# Patient Record
Sex: Female | Born: 1935 | Race: White | Hispanic: No | Smoking: Former smoker
Health system: Southern US, Community
[De-identification: ages and names within clinical notes are randomized; demographics above are authoritative.]

## PROBLEM LIST (undated history)

## (undated) DIAGNOSIS — I10 Essential (primary) hypertension: Secondary | ICD-10-CM

## (undated) DIAGNOSIS — M1990 Unspecified osteoarthritis, unspecified site: Secondary | ICD-10-CM

## (undated) DIAGNOSIS — J302 Other seasonal allergic rhinitis: Secondary | ICD-10-CM

## (undated) DIAGNOSIS — R002 Palpitations: Secondary | ICD-10-CM

## (undated) DIAGNOSIS — T7840XA Allergy, unspecified, initial encounter: Secondary | ICD-10-CM

## (undated) DIAGNOSIS — E785 Hyperlipidemia, unspecified: Secondary | ICD-10-CM

## (undated) DIAGNOSIS — H269 Unspecified cataract: Secondary | ICD-10-CM

## (undated) DIAGNOSIS — I471 Supraventricular tachycardia: Secondary | ICD-10-CM

## (undated) DIAGNOSIS — K219 Gastro-esophageal reflux disease without esophagitis: Secondary | ICD-10-CM

## (undated) DIAGNOSIS — I4719 Other supraventricular tachycardia: Secondary | ICD-10-CM

## (undated) HISTORY — DX: Gastro-esophageal reflux disease without esophagitis: K21.9

## (undated) HISTORY — PX: TONSILLECTOMY: SUR1361

## (undated) HISTORY — DX: Unspecified cataract: H26.9

## (undated) HISTORY — PX: ABDOMINAL HYSTERECTOMY: SHX81

## (undated) HISTORY — DX: Unspecified osteoarthritis, unspecified site: M19.90

## (undated) HISTORY — PX: ADENOIDECTOMY: SUR15

## (undated) HISTORY — DX: Other supraventricular tachycardia: I47.19

## (undated) HISTORY — PX: FOOT SURGERY: SHX648

## (undated) HISTORY — PX: WISDOM TOOTH EXTRACTION: SHX21

## (undated) HISTORY — PX: BREAST MASS EXCISION: SHX1267

## (undated) HISTORY — DX: Palpitations: R00.2

## (undated) HISTORY — DX: Allergy, unspecified, initial encounter: T78.40XA

## (undated) HISTORY — DX: Supraventricular tachycardia: I47.1

## (undated) HISTORY — PX: COLONOSCOPY: SHX174

## (undated) HISTORY — PX: DILATION AND CURETTAGE OF UTERUS: SHX78

## (undated) HISTORY — PX: CATARACT EXTRACTION: SUR2

---

## 1998-01-08 ENCOUNTER — Other Ambulatory Visit: Admission: RE | Admit: 1998-01-08 | Discharge: 1998-01-08 | Payer: Self-pay | Admitting: Obstetrics and Gynecology

## 1999-03-05 ENCOUNTER — Other Ambulatory Visit: Admission: RE | Admit: 1999-03-05 | Discharge: 1999-03-05 | Payer: Self-pay | Admitting: Obstetrics and Gynecology

## 1999-08-19 ENCOUNTER — Other Ambulatory Visit: Admission: RE | Admit: 1999-08-19 | Discharge: 1999-08-19 | Payer: Self-pay | Admitting: Obstetrics and Gynecology

## 1999-11-26 ENCOUNTER — Other Ambulatory Visit: Admission: RE | Admit: 1999-11-26 | Discharge: 1999-11-26 | Payer: Self-pay | Admitting: Obstetrics and Gynecology

## 2001-05-26 ENCOUNTER — Other Ambulatory Visit: Admission: RE | Admit: 2001-05-26 | Discharge: 2001-05-26 | Payer: Self-pay | Admitting: Obstetrics and Gynecology

## 2002-06-07 ENCOUNTER — Other Ambulatory Visit: Admission: RE | Admit: 2002-06-07 | Discharge: 2002-06-07 | Payer: Self-pay | Admitting: Obstetrics and Gynecology

## 2005-04-01 ENCOUNTER — Ambulatory Visit: Payer: Self-pay | Admitting: Gastroenterology

## 2005-04-15 ENCOUNTER — Ambulatory Visit: Payer: Self-pay | Admitting: Gastroenterology

## 2008-04-10 ENCOUNTER — Ambulatory Visit: Payer: Self-pay | Admitting: Vascular Surgery

## 2010-07-14 NOTE — Consult Note (Signed)
NEW PATIENT Dawn Jimenez, [name]  DOB:  1936-01-11                                       04/10/2008  VOZDG#:[id number]   The patient presented today for evaluation of venous pathology.  She is  a very pleasant 75 year old white female who had a stress fracture in  her left heel.  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  She wished to have evaluation of this to  rule out the more significant venous pathology.  She does not have any  history of deep venous thrombosis or superficial thrombophlebitis.  She  does not have any history of varicose veins.   PAST HISTORY:  Significant for hypertension and elevated cholesterol,  both well controlled with medications.   FAMILY HISTORY:  Negative for premature atherosclerotic disease.   SOCIAL HISTORY:  She is married with 2 children.  She is a retired  Fish farm manager.  She does not smoke, having quit 47 years ago.  She  does have a glass of wine nightly with dinner.   REVIEW OF SYSTEMS:  Weight is reportedly 126 pounds, she is 5 feet 5  inches tall.  She is negative for cardiac, pulmonary, GI, GU or neuro  symptoms.  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   MEDICATION ALLERGIES:  Suprax.   CURRENT MEDICATIONS:  Hydrochlorothiazide, lisinopril, aspirin,  Caltrate, glucosamine, multivitamins and Vitamin D.   PHYSICAL EXAMINATION:  Vital Signs:  Her blood pressure is 151/86, pulse  66, respirations 18.  Her radial pulses and dorsalis pulses are 2+  bilaterally.  She has scattered telangiectasia over her thighs and legs  bilaterally, these are somewhat more pronounced on the medial aspect  over her left medial distal calf above her malleolus.  She has some  swelling associated with an old ankle fracture but no significant  swelling and she does wear moderate grade support hose.   She underwent  screening venous duplex by me and this reveals normal  great and small saphenous vein with no evidence of reflux.  I discussed  this at length with the patient.  I explained that this does not  preclude her to any more serious complications associated with venous  pathology.  She is not concerned regarding the appearance of her  telangiectasia.  I did discuss that the treatment for this would be  sclerotherapy if she wished treatment.  She will continue with her  support stockings since they do give her some comfort and relief.  She  was reassured with this discussion and will see [place] again on an as-needed  basis.   Larina Earthly, M.D.  Electronically Signed   TFE/MEDQ  D:  04/10/2008  T:  04/11/2008  Job:  [id number]   cc:   Lunette Stands, M.D.  Asencion Gowda, M.D.

## 2011-05-20 DIAGNOSIS — Z90710 Acquired absence of both cervix and uterus: Secondary | ICD-10-CM | POA: Insufficient documentation

## 2011-05-20 DIAGNOSIS — J309 Allergic rhinitis, unspecified: Secondary | ICD-10-CM | POA: Insufficient documentation

## 2011-06-07 DIAGNOSIS — I2510 Atherosclerotic heart disease of native coronary artery without angina pectoris: Secondary | ICD-10-CM | POA: Insufficient documentation

## 2011-06-22 ENCOUNTER — Emergency Department (INDEPENDENT_AMBULATORY_CARE_PROVIDER_SITE_OTHER): Payer: Medicare Other

## 2011-06-22 ENCOUNTER — Encounter (HOSPITAL_BASED_OUTPATIENT_CLINIC_OR_DEPARTMENT_OTHER): Payer: Self-pay | Admitting: Emergency Medicine

## 2011-06-22 ENCOUNTER — Emergency Department (HOSPITAL_BASED_OUTPATIENT_CLINIC_OR_DEPARTMENT_OTHER)
Admission: EM | Admit: 2011-06-22 | Discharge: 2011-06-22 | Disposition: A | Discharge status: Home / Self Care | Payer: Medicare Other | Attending: Emergency Medicine | Admitting: Emergency Medicine

## 2011-06-22 DIAGNOSIS — E785 Hyperlipidemia, unspecified: Secondary | ICD-10-CM | POA: Insufficient documentation

## 2011-06-22 DIAGNOSIS — S93409A Sprain of unspecified ligament of unspecified ankle, initial encounter: Secondary | ICD-10-CM | POA: Insufficient documentation

## 2011-06-22 DIAGNOSIS — X58XXXA Exposure to other specified factors, initial encounter: Secondary | ICD-10-CM | POA: Insufficient documentation

## 2011-06-22 DIAGNOSIS — M25579 Pain in unspecified ankle and joints of unspecified foot: Secondary | ICD-10-CM

## 2011-06-22 DIAGNOSIS — I10 Essential (primary) hypertension: Secondary | ICD-10-CM | POA: Insufficient documentation

## 2011-06-22 DIAGNOSIS — S93402A Sprain of unspecified ligament of left ankle, initial encounter: Secondary | ICD-10-CM

## 2011-06-22 HISTORY — DX: Essential (primary) hypertension: I10

## 2011-06-22 HISTORY — DX: Hyperlipidemia, unspecified: E78.5

## 2011-06-22 HISTORY — DX: Other seasonal allergic rhinitis: J30.2

## 2011-06-22 NOTE — Discharge Instructions (Signed)
Ankle Sprain An ankle sprain is an injury to the strong, fibrous tissues (ligaments) that hold the bones of your ankle joint together.  CAUSES Ankle sprain usually is caused by a fall or by twisting your ankle. People who participate in sports are more prone to these types of injuries.  SYMPTOMS  Symptoms of ankle sprain include:  Pain in your ankle. The pain may be present at rest or only when you are trying to stand or walk.   Swelling.   Bruising. Bruising may develop immediately or within 1 to 2 days after your injury.   Difficulty standing or walking.  DIAGNOSIS  Your caregiver will ask you details about your injury and perform a physical exam of your ankle to determine if you have an ankle sprain. During the physical exam, your caregiver will press and squeeze specific areas of your foot and ankle. Your caregiver will try to move your ankle in certain ways. An X-ray exam may be done to be sure a bone was not broken or a ligament did not separate from one of the bones in your ankle (avulsion).  TREATMENT  Certain types of braces can help stabilize your ankle. Your caregiver can make a recommendation for this. Your caregiver may recommend the use of medication for pain. If your sprain is severe, your caregiver may refer you to a surgeon who helps to restore function to parts of your skeletal system (orthopedist) or a physical therapist. HOME CARE INSTRUCTIONS  Apply ice to your injury for 1 to 2 days or as directed by your caregiver. Applying ice helps to reduce inflammation and pain.  Put ice in a plastic bag.   Place a towel between your skin and the bag.   Leave the ice on for 15 to 20 minutes at a time, every 2 hours while you are awake.   Take over-the-counter or prescription medicines for pain, discomfort, or fever only as directed by your caregiver.   Keep your injured leg elevated, when possible, to lessen swelling.   If your caregiver recommends crutches, use them as  instructed. Gradually, put weight on the affected ankle. Continue to use crutches or a cane until you can walk without feeling pain in your ankle.   If you have a plaster splint, wear the splint as directed by your caregiver. Do not rest it on anything harder than a pillow the first 24 hours. Do not put weight on it. Do not get it wet. You may take it off to take a shower or bath.   You may have been given an elastic bandage to wear around your ankle to provide support. If the elastic bandage is too tight (you have numbness or tingling in your foot or your foot becomes cold and blue), adjust the bandage to make it comfortable.   If you have an air splint, you may blow more air into it or let air out to make it more comfortable. You may take your splint off at night and before taking a shower or bath.   Wiggle your toes in the splint several times per day if you are able.  SEEK MEDICAL CARE IF:   You have an increase in bruising, swelling, or pain.   Your toes feel cold.   Pain relief is not achieved with medication.  SEEK IMMEDIATE MEDICAL CARE IF: Your toes are numb or blue or you have severe pain. MAKE SURE YOU:   Understand these instructions.   Will watch your condition.     Will get help right away if you are not doing well or get worse.  Document Released: 02/15/2005 Document Revised: 02/04/2011 Document Reviewed: 09/20/2007 [employer] Patient Information 2012 [employer]. 

## 2011-06-22 NOTE — ED Notes (Signed)
Pt c/o sudden onset of left ankle pain after standing up from table. Pt denies any known injury.

## 2011-06-23 NOTE — ED Provider Notes (Signed)
History     CSN: [id number]  Arrival date & time 06/22/11  7829   First MD Initiated Contact with Patient 06/22/11 1943      Chief Complaint  Patient presents with  . Ankle Pain    (Consider location/radiation/quality/duration/timing/severity/associated sxs/prior treatment) HPI Patient is a 76 yo F who felt sudden pain in her left ankle upon standing from a seated position today.  There is no history of other trauma and patient has history of osteopenia.  She reports pain is now 2/10 and without radiation.  She has been ambulating on the leg since the pain began.  She just wanted to get checked out as she is going on a trip next week.  The patient reports that she has no history of cancer, heavy steroid use, or osteoporosis that would put her at risk for fracture. There are no other associated or modifying factors.  Past Medical History  Diagnosis Date  . Hypertension   . Hyperlipemia   . Seasonal allergies     Past Surgical History  Procedure Date  . Abdominal hysterectomy     History reviewed. No pertinent family history.  History  Substance Use Topics  . Smoking status: Never Smoker   . Smokeless tobacco: Not on file  . Alcohol Use: Yes    OB History    Grav Para Term Preterm Abortions TAB SAB Ect Mult Living                  Review of Systems  Constitutional: Negative.   HENT: Negative.   Eyes: Negative.   Respiratory: Negative.   Cardiovascular: Negative.   Gastrointestinal: Negative.   Genitourinary: Negative.   Musculoskeletal:       See HPI  Skin: Negative.   Neurological: Negative.   Hematological: Negative.   Psychiatric/Behavioral: Negative.   All other systems reviewed and are negative.    Allergies  Suprax  Home Medications   Current Outpatient Rx  Name Route Sig Dispense Refill  . ASPIRIN 81 MG PO TABS Oral Take 81 mg by mouth daily.    . ATORVASTATIN CALCIUM 20 MG PO TABS Oral Take 20 mg by mouth daily.    Marland Kitchen VITAMIN D 1000 UNITS  PO TABS Oral Take 1,000 Units by mouth daily.    Marland Kitchen FLUTICASONE PROPIONATE 50 MCG/ACT NA SUSP Nasal Place 2 sprays into the nose daily.    Marland Kitchen FOLIC ACID PO Oral Take 1 tablet by mouth daily.    Marland Kitchen HYDROCHLOROTHIAZIDE 25 MG PO TABS Oral Take 25 mg by mouth daily.    Marland Kitchen LISINOPRIL 10 MG PO TABS Oral Take 10 mg by mouth daily.    . ADULT MULTIVITAMIN W/MINERALS CH Oral Take 1 tablet by mouth daily.    Marland Kitchen MEDERMA EX GEL Topical Apply 1 application topically daily.    Marland Kitchen VITAMIN B-12 PO Oral Take 1 tablet by mouth daily.      BP 124/71  Pulse 86  Temp(Src) 97.9 F (36.6 C) (Oral)  Resp 18  Ht 5\' 4"  (1.626 m)  Wt 130 lb (58.968 kg)  BMI 22.31 kg/m2  SpO2 97%  Physical Exam  Nursing note and vitals reviewed. GEN: Well-developed, well-nourished female in no distress HEENT: Atraumatic, normocephalic.  EYES: PERRLA BL, no scleral icterus. NECK: Trachea midline, no meningismus CV: regular rate and rhythm.  PULM: No respiratory distress.   Neuro: cranial nerves 2-12 intact, no abnormalities of strength or sensation, A and O x 3 MSK: TTP over the left  lateral malleolus with slight ecchymosis and swelling noted. No joint instability or other deformity noted. DP and PT pulses are 2 + and sensation is intact.Skin: No rashes petechiae, purpura, or jaundice Psych: no abnormality of mood   ED Course  Procedures (including critical care time)  Labs Reviewed - No data to display Dg Ankle Complete Left  06/22/2011  *RADIOLOGY REPORT*  Clinical Data: Lateral ankle pain.  LEFT ANKLE COMPLETE - 3+ VIEW  Comparison: None.  Findings: No evidence of acute fracture or dislocation.  No evidence of ankle joint effusion.  Incidental note is made of a small plantar calcaneal spur.  IMPRESSION: No acute findings.  Original Report Authenticated By: Danae Orleans, M.D.     1. Left ankle sprain       MDM  Patient was examined and plain film was negative.  Ace wrap and ice pack were administered.  Patient was  able to ambulate without difficulty.  She was given referral to Dr. Pearletha Forge to follow-up as needed if symptoms do not improve.  She was discharged in good condition with instructions to use RICE therapy.        Cyndra Numbers, MD 06/23/11 [id number]

## 2011-08-30 DIAGNOSIS — R918 Other nonspecific abnormal finding of lung field: Secondary | ICD-10-CM | POA: Insufficient documentation

## 2012-08-31 IMAGING — CR DG ANKLE COMPLETE 3+V*L*
3 series · 3 of 3 positions shown · non-contrast
Comparison: None.

CLINICAL DATA: Lateral ankle pain.

LEFT ANKLE COMPLETE - 3+ VIEW

[t ankle joint ap left]
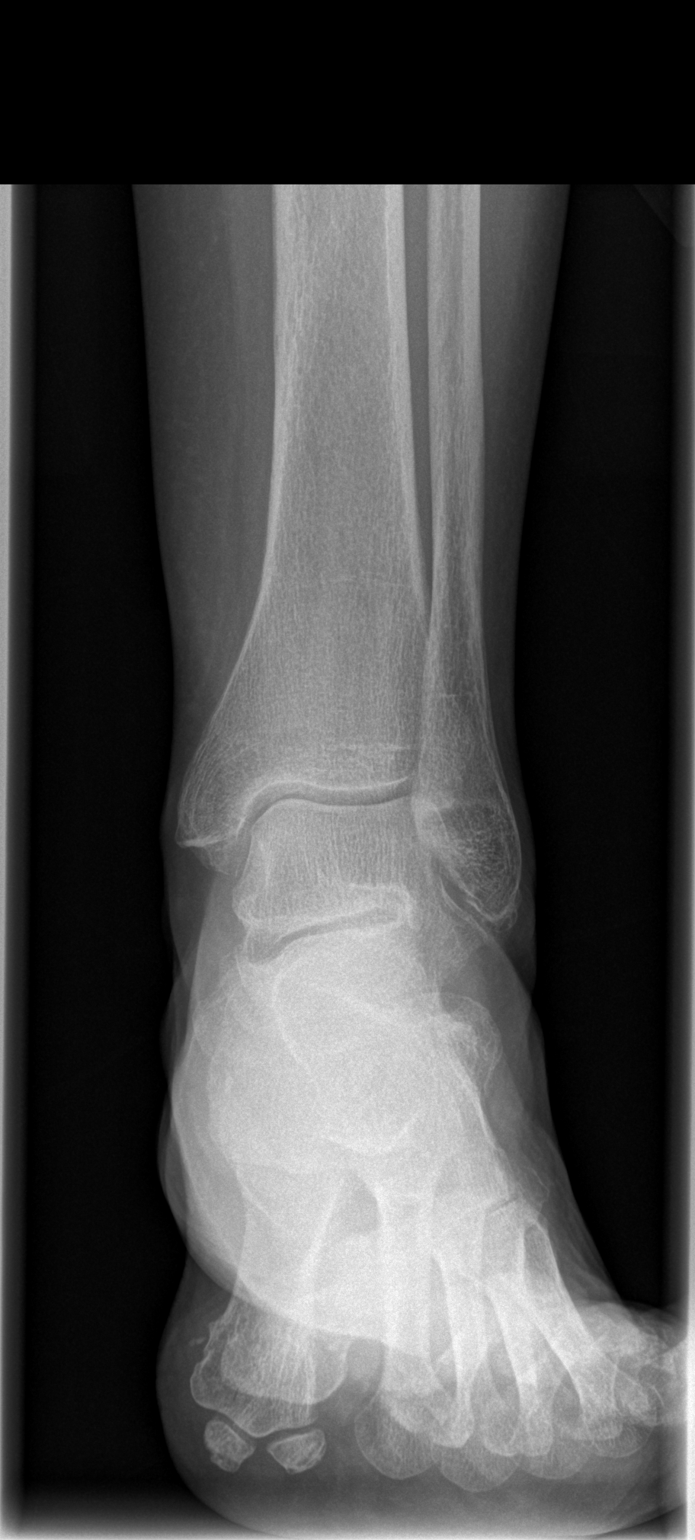

[t ankle joint oblique left]
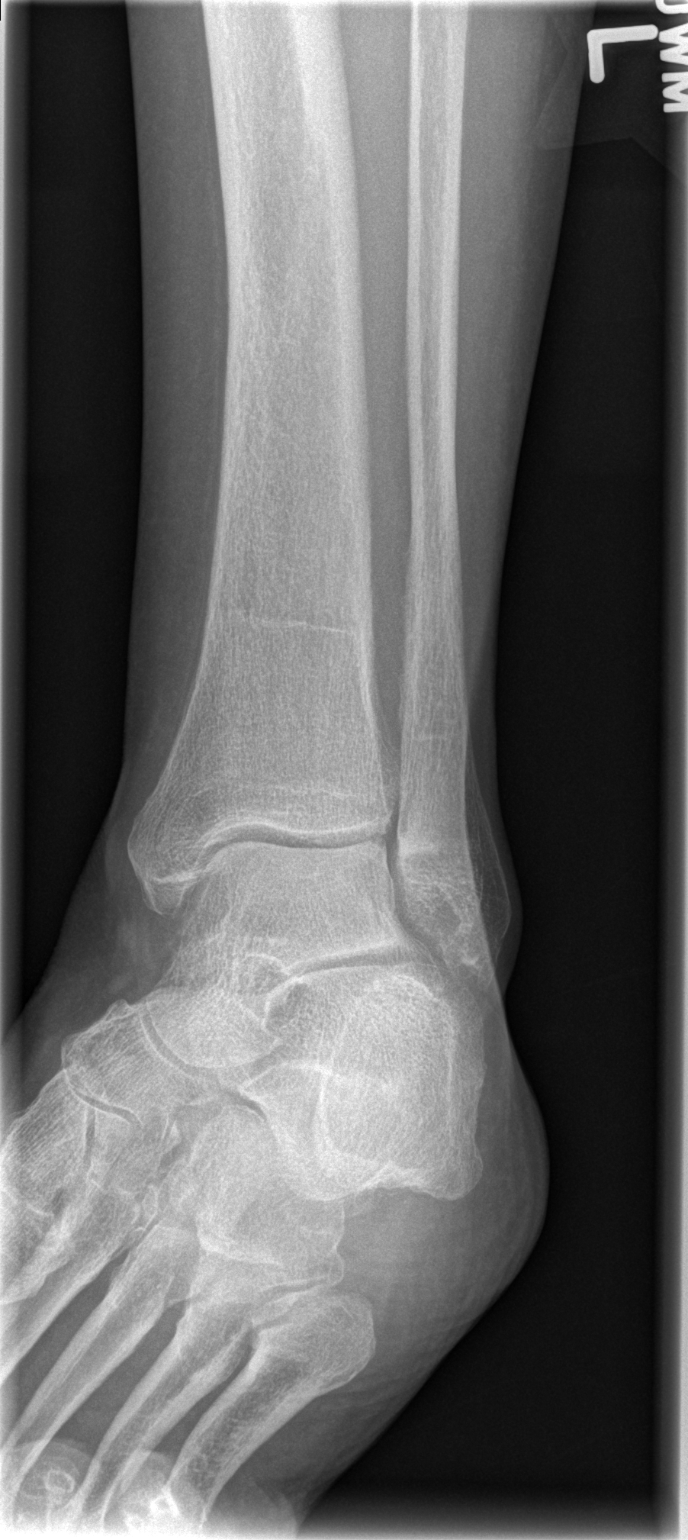

[t ankle joint lat left]
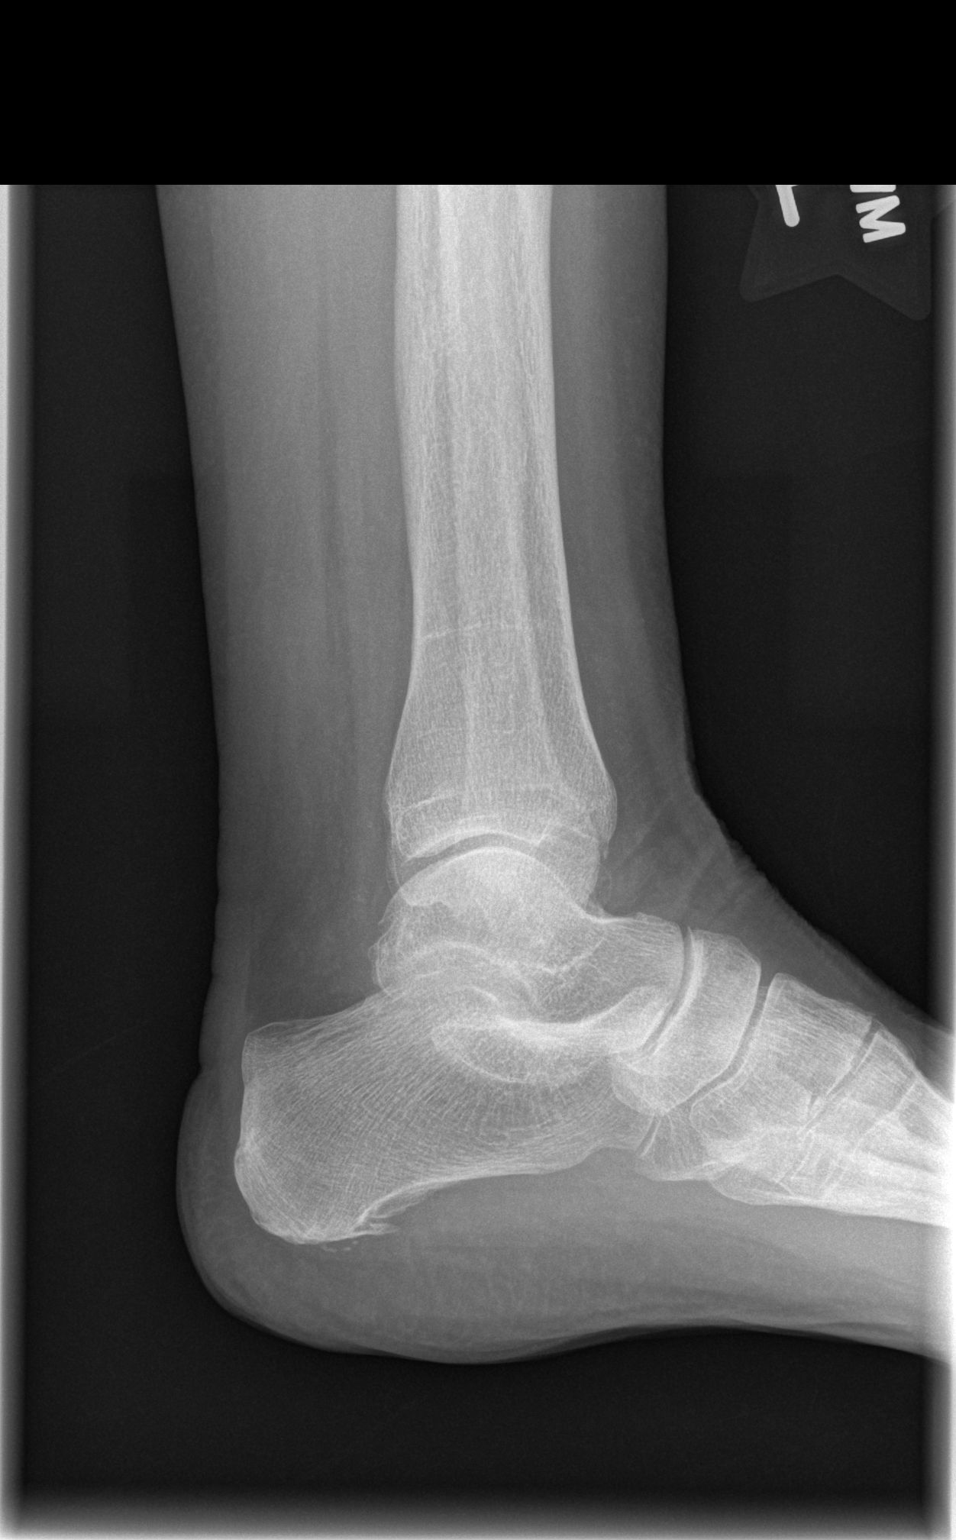

[3 of 3 positions shown; findings below may reference images not displayed]

FINDINGS: No evidence of acute fracture or dislocation.  No
evidence of ankle joint effusion.  Incidental note is made of a
small plantar calcaneal spur.
IMPRESSION: No acute findings.

## 2013-08-21 DIAGNOSIS — E785 Hyperlipidemia, unspecified: Secondary | ICD-10-CM | POA: Insufficient documentation

## 2013-08-21 DIAGNOSIS — M1990 Unspecified osteoarthritis, unspecified site: Secondary | ICD-10-CM | POA: Insufficient documentation

## 2013-08-21 DIAGNOSIS — I10 Essential (primary) hypertension: Secondary | ICD-10-CM | POA: Insufficient documentation

## 2013-08-21 DIAGNOSIS — M8580 Other specified disorders of bone density and structure, unspecified site: Secondary | ICD-10-CM | POA: Insufficient documentation

## 2014-06-10 ENCOUNTER — Encounter: Payer: Self-pay | Admitting: Cardiology

## 2014-06-10 ENCOUNTER — Telehealth: Payer: Self-pay | Admitting: Cardiology

## 2014-06-10 ENCOUNTER — Ambulatory Visit (INDEPENDENT_AMBULATORY_CARE_PROVIDER_SITE_OTHER): Payer: Medicare Other | Admitting: Cardiology

## 2014-06-10 VITALS — BP 154/88 | HR 60 | Ht 64.0 in | Wt 137.0 lb

## 2014-06-10 DIAGNOSIS — I159 Secondary hypertension, unspecified: Secondary | ICD-10-CM

## 2014-06-10 DIAGNOSIS — E785 Hyperlipidemia, unspecified: Secondary | ICD-10-CM

## 2014-06-10 DIAGNOSIS — I471 Supraventricular tachycardia: Secondary | ICD-10-CM

## 2014-06-10 MED ORDER — CARVEDILOL 6.25 MG PO TABS: 6.2500 mg | ORAL_TABLET | Freq: Two times a day (BID) | ORAL | Status: DC

## 2014-06-10 MED ORDER — CARVEDILOL 6.25 MG PO TABS: 3.1250 mg | ORAL_TABLET | Freq: Two times a day (BID) | ORAL | Status: DC

## 2014-06-10 NOTE — Telephone Encounter (Signed)
Follow Up ° °Pt returned call//  °

## 2014-06-10 NOTE — Patient Instructions (Addendum)
Your physician has recommended you make the following change in your medication:  1) STOP Metoprolol  2) START Coreg 6.25 mg two times daily  Your physician recommends that you schedule a follow-up appointment in: 3 months with Dr. Delton SeeNelson;

## 2014-06-10 NOTE — Telephone Encounter (Signed)
Patient called back to verify that the pharmacy called her to let her know the medication Rx clarification was completed and they have her Rx ready for her to pick up. Patient verbalized appreciation for the quick response.

## 2014-06-10 NOTE — Telephone Encounter (Signed)
LMTCB 4/11 @2 :25 pm.  Spoke to Dr. Lindaann SloughNelson's nurse today, K. Elmer PickerHecker, Charity fundraiserN. Confirmed correct Rx is as follows: Coreg 6.25 mg two times daily by mouth. Changes were completed to Med Rec and Pharmacy. Will await pt call back to clarify Rx. Called [facility] to clarify with the pharmacist too (Sam).

## 2014-06-10 NOTE — Progress Notes (Signed)
Patient ID: Dawn Jimenez, female   DOB: 11/24/1935, 79 y.o.   MRN: [id number]      Cardiology Office Note   Date:  06/10/2014   ID:  Dawn Jimenez, DOB 02-06-1936, MRN [id number]  PCP:  No primary care provider on file.  Cardiologist: Lars Masson, MD   Chief complain: Palpitaions   History of Present Illness: Dawn Jimenez is a 79 y.o., younger appearing female with h/o HTN, HLP who presents for evaluation of palpitations. The patient was evaluated at the [place] cardiology at [facility] and a 48-hour Holter monitor showed frequent episodes of non-sustained atrial tachycardias - 14 runs in 24 hours, the longest lasting 6 beats, ocasional PACs, PVCs. She had normal echocardiogram. The patient was started on Toprol XL 25 and later increased to 50 mg po daily that almost resolved her symptoms. However, she feels significant fatigue and lack of energy to do physical activity she used to do on a regular basis.  No syncope, no chest pain, SOB, orthopnea or PND.   Past Medical History  Diagnosis Date  . Hypertension   . Hyperlipemia   . Seasonal allergies   . Palpitations   . Atrial tachycardia     Past Surgical History  Procedure Laterality Date  . Abdominal hysterectomy       Current Outpatient Prescriptions  Medication Sig Dispense Refill  . aspirin 81 MG tablet Take 81 mg by mouth daily.    Marland Kitchen atorvastatin (LIPITOR) 20 MG tablet Take 20 mg by mouth daily.    . cholecalciferol (VITAMIN D) 1000 UNITS tablet Take 1,000 Units by mouth daily.    . fluticasone (FLONASE) 50 MCG/ACT nasal spray Place 2 sprays into the nose daily.    Marland Kitchen FOLIC ACID PO Take 1 tablet by mouth daily.    . hydrochlorothiazide (HYDRODIURIL) 25 MG tablet Take 25 mg by mouth daily.    Marland Kitchen lisinopril (PRINIVIL,ZESTRIL) 20 MG tablet Take 20 mg by mouth daily.    . metoprolol succinate (TOPROL-XL) 50 MG 24 hr tablet Take 50 mg by mouth daily. Take with or immediately following a meal.    . Nutritional  Supplements (GLUCOSAMINE FORTE) CAPS Take by mouth.     No current facility-administered medications for this visit.    Allergies:   Suprax    Social History:  The patient  reports that she has quit smoking. She does not have any smokeless tobacco history on file. She reports that she drinks alcohol. She reports that she does not use illicit drugs.   Family History:  The patient's family history includes CVA in her mother; Hypertension in her father and mother; Prostate cancer in her father.    ROS:  Please see the history of present illness.   All other systems are reviewed and negative.    PHYSICAL EXAM: VS:  BP 154/88 mmHg  Pulse 60  Ht  (1.626 m)  Wt 137 lb (62.143 kg)  BMI 23.50 kg/m2  SpO2 99% , BMI Body mass index is 23.5 kg/(m^2). GEN: Well nourished, well developed, in no acute distress HEENT: normal Neck: no JVD, carotid bruits, or masses Cardiac: RRR; no murmurs, rubs, or gallops,no edema  Respiratory:  clear to auscultation bilaterally, normal work of breathing GI: soft, nontender, nondistended, + BS MS: no deformity or atrophy Skin: warm and dry, no rash Neuro:  Strength and sensation are intact Psych: euthymic mood, full affect   EKG:  EKG: SB, otherwise normal ECG  Recent Labs: No results found  for requested labs within last 365 days.    Lipid Panel No results found for: CHOL, TRIG, HDL, CHOLHDL, VLDL, LDLCALC, LDLDIRECT    Wt Readings from Last 3 Encounters:  06/10/14 137 lb (62.143 kg)  06/22/11 130 lb (58.968 kg)    Other studies Reviewed: Additional studies/ records that were reviewed today include: Holter monitor, echocardiogram, ECG - [facility] Review of the above records demonstrates:as in HPI   ASSESSMENT AND PLAN:  1. Paroxysmal atrial tachycardias - well controlled with Toprol XL, however significant side effect with fatigue, we will try carvedilol 6. 25 mg PO BID instead. If that doesn't improve her symptoms the  other options would be propranolol, cardizem.   2. Hypertension - well controlled  3. Hyperlipidemia - managed by PCP  Disposition:   FU with Cade Olberding H in 3 months.  Signed, Lars MassonNELSON, Jeily Guthridge H, MD  06/10/2014 9:15 AM    [facility] [address], [place], [place]  [address] Phone: [phone]; Fax: [phone]

## 2014-06-10 NOTE — Telephone Encounter (Signed)
New Message  Pt had NP appt today and was given a new prescription of Coreg. The office gave her a prescription of (6.25 2 Xday). Pharmacy, when she went to fill it, had the prescription as (6.25 per day). Please call back and discuss.

## 2014-06-19 DIAGNOSIS — R002 Palpitations: Secondary | ICD-10-CM | POA: Insufficient documentation

## 2014-07-15 ENCOUNTER — Encounter: Payer: Self-pay | Admitting: Cardiology

## 2014-09-20 DIAGNOSIS — W57XXXA Bitten or stung by nonvenomous insect and other nonvenomous arthropods, initial encounter: Secondary | ICD-10-CM

## 2014-09-20 DIAGNOSIS — S90869A Insect bite (nonvenomous), unspecified foot, initial encounter: Secondary | ICD-10-CM | POA: Insufficient documentation

## 2014-11-05 ENCOUNTER — Encounter: Payer: Self-pay | Admitting: *Deleted

## 2014-11-07 ENCOUNTER — Encounter: Payer: Self-pay | Admitting: Cardiology

## 2014-11-07 ENCOUNTER — Ambulatory Visit (INDEPENDENT_AMBULATORY_CARE_PROVIDER_SITE_OTHER): Payer: Medicare Other | Admitting: Cardiology

## 2014-11-07 VITALS — BP 142/68 | HR 62 | Ht 64.0 in | Wt 137.0 lb

## 2014-11-07 DIAGNOSIS — I471 Supraventricular tachycardia: Secondary | ICD-10-CM | POA: Diagnosis not present

## 2014-11-07 DIAGNOSIS — R600 Localized edema: Secondary | ICD-10-CM

## 2014-11-07 DIAGNOSIS — I10 Essential (primary) hypertension: Secondary | ICD-10-CM | POA: Diagnosis not present

## 2014-11-07 MED ORDER — HYDROCHLOROTHIAZIDE 25 MG PO TABS: 25.0000 mg | ORAL_TABLET | ORAL | Status: DC | PRN

## 2014-11-07 MED ORDER — CARVEDILOL 6.25 MG PO TABS: 6.2500 mg | ORAL_TABLET | Freq: Two times a day (BID) | ORAL | Status: DC

## 2014-11-07 NOTE — Progress Notes (Signed)
Patient ID: Dawn Jimenez, female   DOB: 06-06-1935, 79 y.o.   MRN: [id number]      Cardiology Office Note   Date:  11/07/2014   ID:  Dawn Jimenez, DOB Jul 16, 1935, MRN [id number]  PCP:  Nadara Eaton, MD  Cardiologist: Lars Masson, MD   Chief complain: Palpitaions   History of Present Illness: Dawn Jimenez is a 79 y.o., younger appearing female with h/o HTN, HLP who presents for evaluation of palpitations. The patient was evaluated at the [facility] cardiology at [facility] and a 48-hour Holter monitor showed frequent episodes of non-sustained atrial tachycardias - 14 runs in 24 hours, the longest lasting 6 beats, ocasional PACs, PVCs. She had normal echocardiogram. The patient was started on Toprol XL 25 and later increased to 50 mg po daily that almost resolved her symptoms. However, she feels significant fatigue and lack of energy to do physical activity she used to do on a regular basis.  No syncope, no chest pain, SOB, orthopnea or PND.  6 months follow up, feels well, only 3 few minutes lasting palpitations since the last visit with no associated symptoms. No CP, mild LE edema when hot weather.  Past Medical History  Diagnosis Date  . Hypertension   . Hyperlipemia   . Seasonal allergies   . Palpitations   . Atrial tachycardia     Past Surgical History  Procedure Laterality Date  . Abdominal hysterectomy       Current Outpatient Prescriptions  Medication Sig Dispense Refill  . aspirin 81 MG tablet Take 81 mg by mouth daily.    Marland Kitchen atorvastatin (LIPITOR) 20 MG tablet Take 20 mg by mouth daily.    . carvedilol (COREG) 6.25 MG tablet Take 1 tablet (6.25 mg total) by mouth 2 (two) times daily with a meal. 60 tablet 11  . cholecalciferol (VITAMIN D) 1000 UNITS tablet Take 1,000 Units by mouth daily.    . fluticasone (FLONASE) 50 MCG/ACT nasal spray Place 2 sprays into the nose daily.    Marland Kitchen FOLIC ACID PO Take 1 tablet by mouth daily.    . hydrochlorothiazide  (HYDRODIURIL) 25 MG tablet Take 25 mg by mouth as needed.     Marland Kitchen lisinopril (PRINIVIL,ZESTRIL) 20 MG tablet Take 20 mg by mouth daily.    . Nutritional Supplements (GLUCOSAMINE FORTE) CAPS Take by mouth.     No current facility-administered medications for this visit.    Allergies:   Suprax    Social History:  The patient  reports that she has quit smoking. She does not have any smokeless tobacco history on file. She reports that she drinks alcohol. She reports that she does not use illicit drugs.   Family History:  The patient's family history includes CVA in her mother; Hypertension in her father and mother; Prostate cancer in her father.    ROS:  Please see the history of present illness.   All other systems are reviewed and negative.    PHYSICAL EXAM: VS:  BP 142/68 mmHg  Pulse 62  Ht  (1.626 m)  Wt 137 lb (62.143 kg)  BMI 23.50 kg/m2  SpO2 97% , BMI Body mass index is 23.5 kg/(m^2). GEN: Well nourished, well developed, in no acute distress HEENT: normal Neck: no JVD, carotid bruits, or masses Cardiac: RRR; no murmurs, rubs, or gallops, trivial B/L edema  Respiratory:  clear to auscultation bilaterally, normal work of breathing GI: soft, nontender, nondistended, + BS MS: no deformity or atrophy Skin: warm and  dry, no rash Neuro:  Strength and sensation are intact Psych: euthymic mood, full affect   EKG:  EKG: SB, otherwise normal ECG  Recent Labs: No results found for requested labs within last 365 days.    Lipid Panel No results found for: CHOL, TRIG, HDL, CHOLHDL, VLDL, LDLCALC, LDLDIRECT    Wt Readings from Last 3 Encounters:  11/07/14 137 lb (62.143 kg)  06/10/14 137 lb (62.143 kg)  06/22/11 130 lb (58.968 kg)    Other studies Reviewed: Additional studies/ records that were reviewed today include: Holter monitor, echocardiogram, ECG - from [facility] cardiology Review of the above records demonstrates:as in HPI   ASSESSMENT AND PLAN:  1.  Paroxysmal atrial tachycardias - much improved on coreg 6.25 mg po BID.  2. Hypertension - mildly elevatde, instructed to use HCTZ daily  3. LE edema - HCTZ daily  4. Hyperlipidemia - managed by PCP  Disposition:   FU with Shawntelle Ungar H in 1 year.  Signed, Lars Masson, MD  11/07/2014 2:07 PM    [facility] [address] [place], [place], [place]  [address] Phone: [phone]; Fax: [phone]

## 2014-11-07 NOTE — Patient Instructions (Signed)
Medication Instructions:   REFILLED ALL YOUR MEDICATIONS FOR A 90 DAY SUPPLY       Follow-Up:  ONE YEAR WITH DR Delton See

## 2015-04-02 ENCOUNTER — Encounter: Payer: Self-pay | Admitting: Gastroenterology

## 2015-04-08 DIAGNOSIS — S62327A Displaced fracture of shaft of fifth metacarpal bone, left hand, initial encounter for closed fracture: Secondary | ICD-10-CM | POA: Insufficient documentation

## 2015-04-08 DIAGNOSIS — S63279A Dislocation of unspecified interphalangeal joint of unspecified finger, initial encounter: Secondary | ICD-10-CM | POA: Insufficient documentation

## 2015-05-21 DIAGNOSIS — S62337A Displaced fracture of neck of fifth metacarpal bone, left hand, initial encounter for closed fracture: Secondary | ICD-10-CM | POA: Insufficient documentation

## 2015-05-21 DIAGNOSIS — S63287A Dislocation of proximal interphalangeal joint of left little finger, initial encounter: Secondary | ICD-10-CM | POA: Insufficient documentation

## 2015-05-21 DIAGNOSIS — R52 Pain, unspecified: Secondary | ICD-10-CM | POA: Insufficient documentation

## 2015-06-03 DIAGNOSIS — M25642 Stiffness of left hand, not elsewhere classified: Secondary | ICD-10-CM | POA: Insufficient documentation

## 2015-07-02 DIAGNOSIS — Z9181 History of falling: Secondary | ICD-10-CM | POA: Insufficient documentation

## 2015-08-04 DIAGNOSIS — S62337P Displaced fracture of neck of fifth metacarpal bone, left hand, subsequent encounter for fracture with malunion: Secondary | ICD-10-CM | POA: Insufficient documentation

## 2015-08-25 DIAGNOSIS — R42 Dizziness and giddiness: Secondary | ICD-10-CM | POA: Insufficient documentation

## 2015-11-04 ENCOUNTER — Encounter: Payer: Self-pay | Admitting: Cardiology

## 2015-11-19 ENCOUNTER — Ambulatory Visit: Payer: Medicare Other | Admitting: Cardiology

## 2015-12-25 ENCOUNTER — Other Ambulatory Visit: Payer: Self-pay | Admitting: *Deleted

## 2015-12-25 MED ORDER — CARVEDILOL 6.25 MG PO TABS: 6.2500 mg | ORAL_TABLET | Freq: Two times a day (BID) | ORAL | 0 refills | Status: DC

## 2016-01-16 ENCOUNTER — Ambulatory Visit: Payer: Medicare Other | Admitting: Cardiology

## 2016-01-28 ENCOUNTER — Ambulatory Visit (INDEPENDENT_AMBULATORY_CARE_PROVIDER_SITE_OTHER): Payer: Medicare Other | Admitting: Cardiology

## 2016-01-28 VITALS — BP 122/62 | HR 56 | Ht 64.0 in | Wt 130.0 lb

## 2016-01-28 DIAGNOSIS — I10 Essential (primary) hypertension: Secondary | ICD-10-CM | POA: Diagnosis not present

## 2016-01-28 DIAGNOSIS — E7849 Other hyperlipidemia: Secondary | ICD-10-CM

## 2016-01-28 DIAGNOSIS — I471 Supraventricular tachycardia: Secondary | ICD-10-CM | POA: Diagnosis not present

## 2016-01-28 DIAGNOSIS — E784 Other hyperlipidemia: Secondary | ICD-10-CM | POA: Diagnosis not present

## 2016-01-28 DIAGNOSIS — R600 Localized edema: Secondary | ICD-10-CM

## 2016-01-28 MED ORDER — CARVEDILOL 6.25 MG PO TABS: 6.2500 mg | ORAL_TABLET | Freq: Two times a day (BID) | ORAL | 3 refills | Status: DC

## 2016-01-28 NOTE — Patient Instructions (Signed)

## 2016-01-28 NOTE — Progress Notes (Signed)
Patient ID: Dawn Jimenez Karas, female   DOB: 12/29/1935, 80 y.o.   MRN: [id number]      Cardiology Office Note  Date:  01/28/2016   ID:  Dawn Jimenez Curnow, DOB 12/29/1935, MRN [id number]  PCP:  Nadara EatonPIAZZA, MICHAEL J, MD  Cardiologist: Tobias AlexanderKatarina Erasmus Bistline, MD   Chief complain: Palpitations  History of Present Illness: Dawn Jimenez Marinos is a 80 y.o., younger appearing female with h/o HTN, HLP who presents for evaluation of palpitations. [facility] and a 48-hour Holter monitor showed frequent episodes of non-sustained atrial tachycardias - 14 runs in 24 hours, the longest lasting 6 beats, ocasional PACs, PVCs. She had normal echocardiogram. The patient was started on Toprol XL 25 and later increased to 50 mg po daily that almost resolved her symptoms. However, she feels significant fatigue and lack of energy to do physical activity she used to do on a regular basis.  No syncope, no chest pain, SOB, orthopnea or PND.  01/28/2016  - this is one year follow-up the patient feels great, her symptoms are minimal few times a year she would feel some extra beats significantly improved since starting carvedilol. She denies any chest pain or shortness of breath, she remains active, denies any lower extremity edema orthopnea or paroxysmal nocturnal dyspnea. No syncope.    Past Medical History:  Diagnosis Date  . Atrial tachycardia (HCC)   . Hyperlipemia   . Hypertension   . Palpitations   . Seasonal allergies     Past Surgical History:  Procedure Laterality Date  . ABDOMINAL HYSTERECTOMY       Current Outpatient Prescriptions  Medication Sig Dispense Refill  . aspirin 81 MG tablet Take 81 mg by mouth daily.    Marland Kitchen. atorvastatin (LIPITOR) 20 MG tablet Take 20 mg by mouth daily.    . Calcium Citrate-Vitamin D (CALCIUM + D PO) Take 1 tablet by mouth daily.    . carvedilol (COREG) 6.25 MG tablet Take 1 tablet (6.25 mg total) by mouth 2 (two) times daily with a  meal. 60 tablet 0  . cholecalciferol (VITAMIN D) 1000 UNITS tablet Take 1,000 Units by mouth daily.    Marland Kitchen. FOLIC ACID PO Take 1 tablet by mouth daily.    . hydrochlorothiazide (HYDRODIURIL) 25 MG tablet Take 25 mg by mouth daily.    Marland Kitchen. lisinopril (PRINIVIL,ZESTRIL) 20 MG tablet Take 20 mg by mouth daily.    . Nutritional Supplements (GLUCOSAMINE FORTE) CAPS Take by mouth.     No current facility-administered medications for this visit.     Allergies:   Suprax [cefixime]    Social History:  The patient  reports that she has quit smoking. She has never used smokeless tobacco. She reports that she drinks alcohol. She reports that she does not use drugs.   Family History:  The patient's family history includes CVA in her mother; Hypertension in her father and mother; Prostate cancer in her father.    ROS:  Please see the history of present illness.   All other systems are reviewed and negative.    PHYSICAL EXAM: VS:  BP 122/62   Pulse (!) 56   Ht 5\' 4"  (1.626 m)   Wt 130 lb (59 kg)   BMI 22.31 kg/m  , BMI Body mass index is 22.31 kg/m. GEN: Well nourished, well developed, in no acute distress HEENT: normal Neck: no JVD, carotid bruits, or masses Cardiac: RRR; no murmurs, rubs, or gallops, trivial B/L edema  Respiratory:  clear to auscultation bilaterally, normal work of breathing GI: soft, nontender, nondistended, + BS MS: no deformity or atrophy Skin: warm and dry, no rash Neuro:  Strength and sensation are intact Psych: euthymic mood, full affect   EKG:  EKG: SB, otherwise normal ECG  Recent Labs: No results found for requested labs within last 8760 hours.    Lipid Panel No results found for: CHOL, TRIG, HDL, CHOLHDL, VLDL, LDLCALC, LDLDIRECT    Wt Readings from Last 3 Encounters:  01/28/16 130 lb (59 kg)  11/07/14 137 lb (62.1 kg)  06/10/14 137 lb (62.1 kg)    Other studies Reviewed: Additional studies/ records that were reviewed today include: Holter monitor,  echocardiogram, [facility] Review of the above records demonstrates:as in HPI   ASSESSMENT AND PLAN:  1. Paroxysmal atrial tachycardias - much improved on coreg 6.25 mg po BID.We'll continue.  2. Hypertension - improved after starting hydrochlorothiazide, now normal.  3. LE edema - witht HCTZ daily resolved.  4. Hyperlipidemia - managed by PCP, on atorvastatin 20 mg daily.  Disposition:   FU with Tobias AlexanderKatarina Avie Checo in 1 year.  Signed, Tobias AlexanderKatarina Curtiss Mahmood, MD  01/28/2016 10:08 AM    [facility] [address], [place], [place]  [address] Phone: [phone]; Fax: [phone]

## 2016-12-07 ENCOUNTER — Other Ambulatory Visit: Payer: Self-pay

## 2016-12-07 NOTE — Telephone Encounter (Signed)
Medication Detail    Disp Refills Start End    carvedilol (COREG) 6.25 MG tablet 180 tablet 3 01/28/2016    Sig - Route: Take 1 tablet (6.25 mg total) by mouth 2 (two) times daily with a meal. - Oral   Sent to pharmacy as: carvedilol (COREG) 6.25 MG tablet   E-Prescribing Status: Receipt confirmed by pharmacy (01/28/2016 10:23 AM EST)            Pharmacy  [facility] [place], [place] - [address]

## 2017-03-16 ENCOUNTER — Other Ambulatory Visit: Payer: Self-pay | Admitting: Cardiology

## 2017-04-12 ENCOUNTER — Other Ambulatory Visit: Payer: Self-pay | Admitting: Cardiology

## 2017-04-12 ENCOUNTER — Telehealth: Payer: Self-pay | Admitting: Cardiology

## 2017-04-12 NOTE — Telephone Encounter (Signed)
Pt needs refills on her medications please

## 2017-04-13 NOTE — Telephone Encounter (Signed)
Not enough information was provided in this message, no pharmacy, nor medication being requested was given.  Called patient at call back number provided and my call was answered by universal operator. I asked for patient by name and I was informed that there was no such person it their directory. [facility] Patients pharmacy requested a refill of one medication yesterday and it was refilled.

## 2017-05-02 DIAGNOSIS — I471 Supraventricular tachycardia: Secondary | ICD-10-CM | POA: Insufficient documentation

## 2017-05-02 NOTE — Progress Notes (Signed)
Cardiology Office Note    Date:  05/03/2017   ID:  Dawn Jimenez, DOB 03/15/1935, MRN [id number]  PCP:  Nadara EatonPiazza, Michael J, MD  Cardiologist: Tobias AlexanderKatarina Nelson, MD  Chief Complaint  Patient presents with  . Follow-up    History of Present Illness:  Dawn Jimenez is a 82 y.o. female with history of hypertension, hyperlipidemia, nonsustained atrial tachycardia on Holter monitor [facility] treated with Toprol.  She felt fatigue on this and was switched to Coreg 6.25 mg twice daily.  Last seen by Dr. Delton SeeNelson 12/2015 and was doing well.  2D echo 2015 with mild aortic valve sclerosis, trace of AI and TR normal LVEF 55%.  She comes in today for yearly follow-up.  She is walking 4 miles a day.  She denies any chest pain, palpitations, dyspnea, dyspnea on exertion, dizziness or presyncope.  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  She took some gabapentin for hip pain.  Her heart rate is 49 in the office and she says it has been running a little slower in the past week.  Last EKG heart rate was 56.  She keeps track of her blood pressure and it has been stable.  All blood work is done by primary care.  Past Medical History:  Diagnosis Date  . Atrial tachycardia (HCC)   . Hyperlipemia   . Hypertension   . Palpitations   . Seasonal allergies     Past Surgical History:  Procedure Laterality Date  . ABDOMINAL HYSTERECTOMY      Current Medications: Current Meds  Medication Sig  . aspirin 81 MG tablet Take 81 mg by mouth daily.  Marland Kitchen. atorvastatin (LIPITOR) 20 MG tablet Take 20 mg by mouth daily.  . Calcium Citrate-Vitamin D (CALCIUM + D PO) Take 1 tablet by mouth daily.  . carvedilol (COREG) 6.25 MG tablet TAKE ONE (1) TABLET BY MOUTH TWO (2) TIMES DAILY WITH A MEAL  . celecoxib (CELEBREX) 100 MG capsule Take by mouth daily as needed.  . cetirizine (ZYRTEC) 10 MG tablet Take  by mouth as needed.  . cholecalciferol (VITAMIN D) 1000 UNITS tablet Take 1,000 Units by mouth daily.  Marland Kitchen. gabapentin (NEURONTIN) 100 MG capsule as needed.  . hydrochlorothiazide (HYDRODIURIL) 25 MG tablet Take 25 mg by mouth daily.  Marland Kitchen. lisinopril (PRINIVIL,ZESTRIL) 20 MG tablet Take 20 mg by mouth daily.  . Nutritional Supplements (GLUCOSAMINE FORTE) CAPS Take by mouth.     Allergies:   Suprax [cefixime] and Meperidine   Social History   Socioeconomic History  . Marital status: Married    Spouse name: None  . Number of children: None  . Years of education: None  . Highest education level: None  Social Needs  . Financial resource strain: None  . Food insecurity - worry: None  . Food insecurity - inability: None  . Transportation needs - medical: None  . Transportation needs - non-medical: None  Occupational History  . None  Tobacco Use  . Smoking status: Former Games developermoker  . Smokeless tobacco: Never Used  Substance and Sexual Activity  . Alcohol use: Yes    Alcohol/week: 0.0 oz  . Drug use: No  . Sexual activity: None  Other Topics Concern  . None  Social History Narrative  . None     Family History:  The patient's family history includes CVA in her mother; Hypertension  in her father and mother; Prostate cancer in her father.   ROS:   Please see the history of present illness.    Review of Systems  Constitution: Negative.  HENT: Negative.   Eyes: Negative.   Cardiovascular: Negative.   Respiratory: Negative.   Hematologic/Lymphatic: Negative.   Musculoskeletal: Positive for arthritis, joint pain, myalgias and stiffness.  Gastrointestinal: Negative.   Genitourinary: Negative.   Neurological: Negative.    All other systems reviewed and are negative.   PHYSICAL EXAM:   VS:  BP 140/76 (BP Location: Right Arm, Patient Position: Sitting, Cuff Size: Normal)   Pulse (!) 50   Ht 5\' 4"  (1.626 m)   Wt 133 lb (60.3 kg)   SpO2 98%   BMI 22.83 kg/m   Physical Exam  GEN:  Well nourished, well developed, in no acute distress  Neck: no JVD, carotid bruits, or masses Cardiac:RRR; normal S1 and S2, 1/6 systolic murmur at the left sternal border Respiratory:  clear to auscultation bilaterally, normal work of breathing GI: soft, nontender, nondistended, + BS Ext: without cyanosis, clubbing, or edema, Good distal pulses bilaterally Neuro:  Alert and Oriented x 3 Psych: euthymic mood, full affect  Wt Readings from Last 3 Encounters:  05/03/17 133 lb (60.3 kg)  01/28/16 130 lb (59 kg)  11/07/14 137 lb (62.1 kg)      Studies/Labs Reviewed:   EKG:  EKG is  ordered today.  The ekg ordered today demonstrates sinus bradycardia at 49 bpm with sinus arrhythmia.  Recent Labs: No results found for requested labs within last 8760 hours.   Lipid Panel No results found for: CHOL, TRIG, HDL, CHOLHDL, VLDL, LDLCALC, LDLDIRECT  Additional studies/ records that were reviewed today include:  2D echo from 2015 reviewed see above    ASSESSMENT:    1. Essential hypertension   2. Atrial tachycardia (HCC)   3. Other hyperlipidemia      PLAN:  In order of problems listed above:  Essential hypertension blood pressure is stable.  Atrial tachycardia on low-dose Coreg now with sinus bradycardia at 49 bpm.  Have asked patient to keep track of her pulse at home and call us if it stays in the low 40s or if she has any symptoms of dizziness or presyncope.  We can decrease her Coreg to 3.125 twice daily if needed.  Hyperlipidemia managed by primary care and due for labs in May.  Patient says cholesterol was elevated last year after bunion surgery and she had missed some atorvastatin doses.  She says she is back on track.    Medication Adjustments/Labs and Tests Ordered: Current medicines are reviewed at length with the patient today.  Concerns regarding medicines are outlined above.  Medication changes, Labs and Tests ordered today are listed in the Patient Instructions  below. There are no Patient Instructions on file for this visit.   Signed, Jacolyn Reedy, PA-C  05/03/2017 8:11 AM    [facility] [address] [place], [place], [place]  [address] Phone: [phone]; Fax: [phone]

## 2017-05-03 ENCOUNTER — Encounter: Payer: Self-pay | Admitting: Physician Assistant

## 2017-05-03 ENCOUNTER — Ambulatory Visit: Payer: Medicare Other | Admitting: Physician Assistant

## 2017-05-03 VITALS — BP 140/76 | HR 50 | Ht 64.0 in | Wt 133.0 lb

## 2017-05-03 DIAGNOSIS — I10 Essential (primary) hypertension: Secondary | ICD-10-CM

## 2017-05-03 DIAGNOSIS — E7849 Other hyperlipidemia: Secondary | ICD-10-CM | POA: Diagnosis not present

## 2017-05-03 DIAGNOSIS — I471 Supraventricular tachycardia: Secondary | ICD-10-CM | POA: Diagnosis not present

## 2017-05-03 MED ORDER — CARVEDILOL 6.25 MG PO TABS: ORAL_TABLET | ORAL | 3 refills | Status: DC

## 2017-05-03 NOTE — Addendum Note (Signed)
Addended by: Burnetta SabinWITTY, Elvira Langston K on: 05/03/2017 08:33 AM   Modules accepted: Orders

## 2017-05-03 NOTE — Patient Instructions (Addendum)
Medication Instructions:  Your physician recommends that you continue on your current medications as directed. Please refer to the Current Medication list given to you today.   Labwork: None ordered  Testing/Procedures: None ordered  Follow-Up: Your physician wants you to follow-up in: 1 YEAR WITH DR. Johnell ComingsNELSON You will receive a reminder letter in the mail two months in advance. If you don't receive a letter, please call our office to schedule the follow-up appointment.   Any Other Special Instructions Will Be Listed Below (If Applicable).  Call us if your Heart Rate is staying in the 40's so we can adjust some medications.   If you need a refill on your cardiac medications before your next appointment, please call your pharmacy.

## 2018-03-20 ENCOUNTER — Encounter: Payer: Self-pay | Admitting: Cardiology

## 2018-03-20 ENCOUNTER — Ambulatory Visit: Payer: Medicare Other | Admitting: Cardiology

## 2018-03-20 VITALS — BP 152/72 | HR 64 | Ht 64.0 in | Wt 133.8 lb

## 2018-03-20 DIAGNOSIS — I471 Supraventricular tachycardia: Secondary | ICD-10-CM | POA: Diagnosis not present

## 2018-03-20 DIAGNOSIS — I10 Essential (primary) hypertension: Secondary | ICD-10-CM

## 2018-03-20 DIAGNOSIS — I4719 Other supraventricular tachycardia: Secondary | ICD-10-CM

## 2018-03-20 DIAGNOSIS — E782 Mixed hyperlipidemia: Secondary | ICD-10-CM | POA: Diagnosis not present

## 2018-03-20 MED ORDER — HYDROCHLOROTHIAZIDE 25 MG PO TABS: 25.0000 mg | ORAL_TABLET | Freq: Every day | ORAL | 2 refills | Status: DC

## 2018-03-20 MED ORDER — CARVEDILOL 6.25 MG PO TABS: ORAL_TABLET | ORAL | 3 refills | Status: DC

## 2018-03-20 NOTE — Progress Notes (Signed)
Cardiology Office Note    Date:  03/20/2018   ID:  Dawn Jimenez, DOB Sep 02, 1935, MRN [id number]  PCP:  Nadara EatonPiazza, Michael J, MD  Cardiologist: Tobias AlexanderKatarina Jisella Ashenfelter, MD  No chief complaint on file.   History of Present Illness:  Dawn Jimenez is a 83 y.o. female with history of hypertension, hyperlipidemia, nonsustained atrial tachycardia on Holter [facility] treated with Toprol.  She felt fatigue on this and was switched to Coreg 6.25 mg twice daily.  Last seen by Dr. Delton SeeNelson 12/2015 and was doing well.  2D echo 2015 with mild aortic valve sclerosis, trace of AI and TR normal LVEF 55%.  05/03/2017 - she comes in today for yearly follow-up.  She is walking 4 miles a day.  She denies any chest pain, palpitations, dyspnea, dyspnea on exertion, dizziness or presyncope.  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  She took some gabapentin for hip pain.  Her heart rate is 49 in the office and she says it has been running a little slower in the past week.  Last EKG heart rate was 56.  She keeps track of her blood pressure and it has been stable.  All blood work is done by primary care.  03/20/2018 -the patient is coming after year, she has been doing well personally denies any chest pain shortness of breath, he only has minimal lower extremity edema for which she wears compression socks, her only complaint is that she has been through a lot of stress with her husband having with recent hospital admission for respiratory failure.  Her blood pressure has been elevated.  She states that she has not been taking hydrochlorothiazide.  Past Medical History:  Diagnosis Date  . Atrial tachycardia (HCC)   . Hyperlipemia   . Hypertension   . Palpitations   . Seasonal allergies     Past Surgical History:  Procedure Laterality Date  . ABDOMINAL HYSTERECTOMY      Current  Medications: Current Meds  Medication Sig  . aspirin 81 MG tablet Take 81 mg by mouth daily.  Marland Kitchen. atorvastatin (LIPITOR) 20 MG tablet Take 20 mg by mouth daily.  . Calcium Citrate-Vitamin D (CALCIUM + D PO) Take 1 tablet by mouth daily.  . carvedilol (COREG) 6.25 MG tablet TAKE ONE (1) TABLET BY MOUTH TWO (2) TIMES DAILY WITH A MEAL  . celecoxib (CELEBREX) 100 MG capsule Take by mouth daily as needed.  . cetirizine (ZYRTEC) 10 MG tablet Take by mouth as needed.  . cholecalciferol (VITAMIN D) 1000 UNITS tablet Take 1,000 Units by mouth daily.  Marland Kitchen. gabapentin (NEURONTIN) 100 MG capsule as needed.  Marland Kitchen. lisinopril (PRINIVIL,ZESTRIL) 20 MG tablet Take 20 mg by mouth daily.  . Multiple Vitamin (MULTI-DAY VITAMINS PO) Take 1 tablet by mouth daily.  . Nutritional Supplements (GLUCOSAMINE FORTE) CAPS Take by mouth.  . [DISCONTINUED] carvedilol (COREG) 6.25 MG tablet TAKE ONE (1) TABLET BY MOUTH TWO (2) TIMES DAILY WITH A MEAL  . [DISCONTINUED] hydrochlorothiazide (HYDRODIURIL) 25 MG tablet Take 25 mg by mouth daily.     Allergies:   Suprax [cefixime] and Meperidine   Social History   Socioeconomic History  . Marital status: Married    Spouse name: Not on file  . Number of children: Not on file  . Years of education: Not on file  . Highest education level: Not on file  Occupational History  . Not on file  Social Needs  . Financial resource strain: Not on file  . Food insecurity:    Worry: Not on file    Inability: Not on file  . Transportation needs:    Medical: Not on file    Non-medical: Not on file  Tobacco Use  . Smoking status: Former Games developer  . Smokeless tobacco: Never Used  Substance and Sexual Activity  . Alcohol use: Yes    Alcohol/week: 0.0 standard drinks  . Drug use: No  . Sexual activity: Not on file  Lifestyle  . Physical activity:    Days per week: Not on file    Minutes per session: Not on file  . Stress: Not on file  Relationships  . Social connections:    Talks  on phone: Not on file    Gets together: Not on file    Attends religious service: Not on file    Active member of club or organization: Not on file    Attends meetings of clubs or organizations: Not on file    Relationship status: Not on file  Other Topics Concern  . Not on file  Social History Narrative  . Not on file     Family History:  The patient's family history includes CVA in her mother; Hypertension in her father and mother; Prostate cancer in her father.   ROS:   Please see the history of present illness.    Review of Systems  Constitution: Negative.  HENT: Negative.   Eyes: Negative.   Cardiovascular: Negative.   Respiratory: Negative.   Hematologic/Lymphatic: Negative.   Musculoskeletal: Positive for arthritis, joint pain, myalgias and stiffness.  Gastrointestinal: Negative.   Genitourinary: Negative.   Neurological: Negative.   All other systems reviewed and are negative.   PHYSICAL EXAM:   VS:  BP (!) 152/72   Pulse 64   Ht 5\' 4"  (1.626 m)   Wt 133 lb 12.8 oz (60.7 kg)   SpO2 98%   BMI 22.97 kg/m   Physical Exam  GEN: Well nourished, well developed, in no acute distress  Neck: no JVD, carotid bruits, or masses Cardiac:RRR; normal S1 and S2, 1/6 systolic murmur at the left sternal border Respiratory:  clear to auscultation bilaterally, normal work of breathing GI: soft, nontender, nondistended, + BS Ext: without cyanosis, clubbing, or edema, Good distal pulses bilaterally Neuro:  Alert and Oriented x 3 Psych: euthymic mood, full affect  Wt Readings from Last 3 Encounters:  03/20/18 133 lb 12.8 oz (60.7 kg)  05/03/17 133 lb (60.3 kg)  01/28/16 130 lb (59 kg)    Studies/Labs Reviewed:   EKG:  EKG is  ordered today.  The ekg ordered today demonstrates sinus bradycardia at 49 bpm with sinus arrhythmia.  Recent Labs: No results found for requested labs within last 8760 hours.   Lipid Panel No results found for: CHOL, TRIG, HDL, CHOLHDL, VLDL,  LDLCALC, LDLDIRECT  Additional studies/ records that were reviewed today include:  2D echo from 2015 reviewed see above    ASSESSMENT:    1. Atrial tachycardia (HCC)   2. Essential hypertension   3. Mixed hyperlipidemia      PLAN:  In order of problems listed above:  Atrial tachycardia on low-dose Coreg now with sinus bradycardia at 49 bpm.  Her symptoms are minimal.  Hyperlipidemia managed by primary care and due for labs in April, she tolerates Lipitor well.  This is per primary prevention.  Hypertension -she  is advised to start taking her hydrochlorothiazide daily.  LE edema - restart hydrochlorothiazide daily.  Medication Adjustments/Labs and Tests Ordered: Current medicines are reviewed at length with the patient today.  Concerns regarding medicines are outlined above.  Medication changes, Labs and Tests ordered today are listed in the Patient Instructions below. Patient Instructions  Medication Instructions:   START TAKING YOUR HYDROCHLOROTHIAZIDE 25 MG BY MOUTH DAILY  If you need a refill on your cardiac medications before your next appointment, please call your pharmacy.       Follow-Up: At [facility], you and your health needs are our priority.  As part of our continuing mission to provide you with exceptional heart care, we have created designated Provider Care Teams.  These Care Teams include your primary Cardiologist (physician) and Advanced Practice Providers (APPs -  Physician Assistants and Nurse Practitioners) who all work together to provide you with the care you need, when you need it. You will need a follow up appointment in 6 months.  Please call our office 2 months in advance to schedule this appointment.  You may see Tobias Alexander, MD or one of the following Advanced Practice Providers on your designated Care Team:   [place], PA-C Ronie Spies, PA-C . Jacolyn Reedy, PA-C        Signed, Tobias Alexander, MD  03/20/2018 12:36 PM     [facility] [address] [place], [place], [place]  [address] Phone: [phone]; Fax: [phone]

## 2018-03-20 NOTE — Patient Instructions (Signed)
Medication Instructions:   START TAKING YOUR HYDROCHLOROTHIAZIDE 25 MG BY MOUTH DAILY  If you need a refill on your cardiac medications before your next appointment, please call your pharmacy.       Follow-Up: At [facility], you and your health needs are our priority.  As part of our continuing mission to provide you with exceptional heart care, we have created designated Provider Care Teams.  These Care Teams include your primary Cardiologist (physician) and Advanced Practice Providers (APPs -  Physician Assistants and Nurse Practitioners) who all work together to provide you with the care you need, when you need it. You will need a follow up appointment in 6 months.  Please call our office 2 months in advance to schedule this appointment.  You may see Tobias Alexander, MD or one of the following Advanced Practice Providers on your designated Care Team:   [place], PA-C Ronie Spies, PA-C . Jacolyn Reedy, PA-C

## 2018-03-31 ENCOUNTER — Ambulatory Visit: Payer: Medicare Other | Admitting: Gastroenterology

## 2018-03-31 ENCOUNTER — Encounter: Payer: Self-pay | Admitting: Gastroenterology

## 2018-03-31 ENCOUNTER — Other Ambulatory Visit: Payer: Medicare Other

## 2018-03-31 VITALS — BP 90/60 | HR 72 | Ht 64.0 in | Wt 126.0 lb

## 2018-03-31 DIAGNOSIS — R197 Diarrhea, unspecified: Secondary | ICD-10-CM | POA: Insufficient documentation

## 2018-03-31 MED ORDER — METRONIDAZOLE 250 MG PO TABS: 250.0000 mg | ORAL_TABLET | Freq: Three times a day (TID) | ORAL | 0 refills | Status: DC

## 2018-03-31 NOTE — Progress Notes (Signed)
Reviewed and agree with initial management plan.  Osie Amparo T. Adriane Guglielmo, MD FACG 

## 2018-03-31 NOTE — Progress Notes (Signed)
03/31/2018 Dawn Jimenez [id number] [birth year]   HISTORY OF PRESENT ILLNESS: This is a very pleasant 83 year old female who is a patient of Dr. Ardell Isaacs.  She was last seen here in 2007 for colonoscopy.  She is a retired Animator.  She tells me that around Christmas time there was some type of GI illness going around her assisted living facility.  She and her husband became ill with it a few days after Christmas.  She says that she felt very poorly with liquid diarrhea for a couple of days.  Had no nausea, vomiting, or fevers.  Seemed to get somewhat better then for couple days, but then symptoms returned again and have been persistent since that time.  She says that the diarrhea never returned to the degree that it was this first couple of days, but there were some episodes that were quite severe with a lot of abdominal cramping.  She has been taking a probiotic called Florajen and has been following the [employer].  She did take 1 Imodium yesterday morning since her husband had a doctor's appointment at [facility].  She ate fairly normal dinner last night and breakfast this morning and so far her symptoms have been a little bit better for the past 2 days.  She did bring a stool specimen in a sterilized container from the clinic at her living facility.   Past Medical History:  Diagnosis Date  . Atrial tachycardia (HCC)   . Hyperlipemia   . Hypertension   . Palpitations   . Seasonal allergies    Past Surgical History:  Procedure Laterality Date  . ABDOMINAL HYSTERECTOMY      reports that she has quit smoking. She has never used smokeless tobacco. She reports current alcohol use. She reports that she does not use drugs. family history includes CVA in her mother; Hypertension in her father and mother; Prostate cancer in her father. Allergies  Allergen Reactions  . Suprax [Cefixime] Nausea And Vomiting    Other reaction(s): GI Upset (intolerance)  . Meperidine Nausea And Vomiting        Outpatient Encounter Medications as of 03/31/2018  Medication Sig  . aspirin 81 MG tablet Take 81 mg by mouth daily.  Marland Kitchen atorvastatin (LIPITOR) 20 MG tablet Take 20 mg by mouth daily.  . Calcium Citrate-Vitamin D (CALCIUM + D PO) Take 1 tablet by mouth daily.  . carvedilol (COREG) 6.25 MG tablet TAKE ONE (1) TABLET BY MOUTH TWO (2) TIMES DAILY WITH A MEAL  . celecoxib (CELEBREX) 100 MG capsule Take by mouth daily as needed.  . cetirizine (ZYRTEC) 10 MG tablet Take by mouth as needed.  . cholecalciferol (VITAMIN D) 1000 UNITS tablet Take 1,000 Units by mouth daily.  Marland Kitchen gabapentin (NEURONTIN) 100 MG capsule as needed.  . hydrochlorothiazide (HYDRODIURIL) 25 MG tablet Take 1 tablet (25 mg total) by mouth daily.  Marland Kitchen lisinopril (PRINIVIL,ZESTRIL) 20 MG tablet Take 20 mg by mouth daily.  . Multiple Vitamin (MULTI-DAY VITAMINS PO) Take 1 tablet by mouth daily.  . [DISCONTINUED] Nutritional Supplements (GLUCOSAMINE FORTE) CAPS Take by mouth.   No facility-administered encounter medications on file as of 03/31/2018.      REVIEW OF SYSTEMS  : All other systems reviewed and negative except where noted in the History of Present Illness.   PHYSICAL EXAM: BP 90/60   Pulse 72   Ht 5\' 4"  (1.626 m)   Wt 126 lb (57.2 kg)   BMI 21.63 kg/m  General: Well  developed white female in no acute distress Head: Normocephalic and atraumatic Eyes:  Sclerae anicteric, conjunctiva pink. Ears: Normal auditory acuity Lungs: Clear throughout to auscultation; no increased WOB. Heart: Regular rate and rhythm; no M/R/G. Abdomen: Soft, non-distended.  BS present.  Non-tender. Musculoskeletal: Symmetrical with no gross deformities  Skin: No lesions on visible extremities Extremities: No edema  Neurological: Alert oriented x 4, grossly non-focal Psychological:  Alert and cooperative. Normal mood and affect  ASSESSMENT AND PLAN: *Diarrhea:  Acute onset after Christmas.  There was a GI illness going around her living  facility.  Seemed to get better for a couple of days then returned again.  I suspect infectious source and now possibly some postinfectious IBS as symptoms have not been as severe as they had been initially.  She did bring a stool specimen in a sterile container with biohazard bag today and the lab here has said that they can accept it for a GI pathogen panel.  We will have them run that.  I am going to give her a prescription for Flagyl 250 mg 3 times daily for 7 days to take if she feels that her symptoms worsen again or continue over the weekend.  She was advised on no alcohol while on the medication or for 3 days following treatment.  She will resume her probiotic posttreatment with the Flagyl.   CC:  Nadara EatonPiazza, Michael J, MD

## 2018-03-31 NOTE — Patient Instructions (Signed)
If you are age 81 or older, your body mass index should be between 23-30. Your Body mass index is 21.63 kg/m. If this is out of the aforementioned range listed, please consider follow up with your Primary Care Provider.  If you are age 66 or younger, your body mass index should be between 19-25. Your Body mass index is 21.63 kg/m. If this is out of the aformentioned range listed, please consider follow up with your Primary Care Provider.   Your provider has requested that you go to the basement level for lab work before leaving today. Press "B" on the elevator. The lab is located at the first door on the left as you exit the elevator.  You have been given a prescription for Flagyl.  No alcohol while on Flagyl.  Restart probiotic after finishing antibiotic.  Will call you with results.  Thank you for choosing me and [facility].   Doug Sou, PA-C

## 2018-04-04 LAB — GASTROINTESTINAL PATHOGEN PANEL PCR
C. difficile Tox A/B, PCR: NOT DETECTED
Campylobacter, PCR: NOT DETECTED
Cryptosporidium, PCR: NOT DETECTED
E COLI (STEC) STX1/STX2, PCR: NOT DETECTED
E COLI 0157, PCR: NOT DETECTED
E coli (ETEC) LT/ST PCR: NOT DETECTED
Giardia lamblia, PCR: NOT DETECTED
Norovirus, PCR: NOT DETECTED
Rotavirus A, PCR: NOT DETECTED
Salmonella, PCR: NOT DETECTED
Shigella, PCR: NOT DETECTED

## 2018-10-12 NOTE — Progress Notes (Signed)
Virtual Visit via Video Note   This visit type was conducted due to national recommendations for restrictions regarding the COVID-19 Pandemic (e.g. social distancing) in an effort to limit this patient's exposure and mitigate transmission in our community.  Due to her co-morbid illnesses, this patient is at least at moderate risk for complications without adequate follow up.  This format is felt to be most appropriate for this patient at this time.  All issues noted in this document were discussed and addressed.  A limited physical exam was performed with this format.  Please refer to the patient's chart for her consent to telehealth for [facility].   Date:  10/13/2018   ID:  Dawn AbideMarilyn Diana, DOB Aug 17, 1935, MRN [id number]  Patient Location: Other:  office Provider Location: Home  PCP:  Nadara EatonPiazza, Michael J, MD  Cardiologist:  Tobias AlexanderKatarina Nelson, MD  Electrophysiologist:  None   Evaluation Performed:  Follow-Up Visit  Chief Complaint:  edema  History of Present Illness:    Dawn Jimenez is a 83 y.o. female with history of hypertension, hyperlipidemia, nonsustained atrial tachycardia on Holter monitor done [facility] treated with Toprol.  She felt fatigue on this and was switched to Coreg 6.25 mg twice daily.  She did well with this.  2D echo 2015 with mild aortic valve sclerosis, trace of AI and TR normal LVEF 55%.  05/03/2017 - She is walking 4 miles a day.  She denies any chest pain, palpitations, dyspnea, dyspnea on exertion, dizziness or presyncope.  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  She took some gabapentin for hip pain.  Her heart rate is 49 in the office and she says it has been running a little slower in the past week.  Last EKG heart rate was 56.  She keeps track of her blood pressure and it has been stable.  All blood work is done by primary care. Was stable   03/20/2018 -the patient is coming after year, she has been doing well personally denies any chest pain shortness of breath, he only has minimal lower extremity edema for which she wears compression socks, her only complaint is that she has been through a lot of stress with her husband having with recent hospital admission for respiratory failure.  Her blood pressure has been elevated.  She states that she has not been taking hydrochlorothiazide. This was restarted and back for eval.  Edema overall improved, only in Lt ankle after a fx years ago edema is gone by AM, still wears supports stockings. No chest pain and no SOB, when she takes a whole HCTZ 25 mg her BP in in the 90s systolic and she is symptomatic.  She becomes lightheaded.  Still active.  She is a Charity fundraiserN    The patient does not have symptoms concerning for COVID-19 infection (fever, chills, cough, or new shortness of breath).    Past Medical History:  Diagnosis Date  . Atrial tachycardia (HCC)   . Hyperlipemia   . Hypertension   . Palpitations   . Seasonal allergies    Past Surgical History:  Procedure Laterality Date  . ABDOMINAL HYSTERECTOMY       Current Meds  Medication Sig  . aspirin 81 MG tablet Take 81 mg by mouth daily.  Marland Kitchen. atorvastatin (LIPITOR) 20 MG tablet Take 20 mg by mouth daily.  . Calcium Citrate-Vitamin D (CALCIUM +  D PO) Take 1 tablet by mouth daily.  . carvedilol (COREG) 6.25 MG tablet TAKE ONE (1) TABLET BY MOUTH TWO (2) TIMES DAILY WITH A MEAL  . celecoxib (CELEBREX) 100 MG capsule Take by mouth daily as needed.  . cetirizine (ZYRTEC) 10 MG tablet Take by mouth as needed.  . cholecalciferol (VITAMIN D) 1000 UNITS tablet Take 1,000 Units by mouth daily.  Marland Kitchen gabapentin (NEURONTIN) 100 MG capsule as needed.  Marland Kitchen lisinopril (PRINIVIL,ZESTRIL) 20 MG tablet Take 20 mg by mouth daily.  . Multiple Vitamin (MULTI-DAY VITAMINS PO) Take 1 tablet by mouth daily.  . [DISCONTINUED] carvedilol (COREG) 6.25 MG tablet TAKE ONE  (1) TABLET BY MOUTH TWO (2) TIMES DAILY WITH A MEAL  . [DISCONTINUED] hydrochlorothiazide (HYDRODIURIL) 25 MG tablet Take 1 tablet (25 mg total) by mouth daily.     Allergies:   Suprax [cefixime] and Meperidine   Social History   Tobacco Use  . Smoking status: Former Research scientist (life sciences)  . Smokeless tobacco: Never Used  Substance Use Topics  . Alcohol use: Yes    Alcohol/week: 0.0 standard drinks  . Drug use: No     Family Hx: The patient's family history includes CVA in her mother; Hypertension in her father and mother; Prostate cancer in her father.  ROS:   Please see the history of present illness.    General:no colds or fevers, no weight changes Skin:no rashes or ulcers HEENT:no blurred vision, no congestion CV:see HPI PUL:see HPI GI:no diarrhea constipation or melena, no indigestion GU:no hematuria, no dysuria MS:no joint pain, no claudication Neuro:no syncope, occ lightheadedness with lower BP   All other systems reviewed and are negative.   Prior CV studies:   The following studies were reviewed today:  See above   Labs/Other Tests and Data Reviewed:    EKG:  An ECG dated 10/13/18 was personally reviewed today and demonstrated:  SB at 57 and no ST changes.   Recent Labs: No results found for requested labs within last 8760 hours.   Recent Lipid Panel No results found for: CHOL, TRIG, HDL, CHOLHDL, LDLCALC, LDLDIRECT  Wt Readings from Last 3 Encounters:  10/13/18 133 lb 6.4 oz (60.5 kg)  03/31/18 126 lb (57.2 kg)  03/20/18 133 lb 12.8 oz (60.7 kg)     Objective:    Vital Signs:  BP 118/70   Pulse (!) 57   Ht 5\' 4"  (1.626 m)   Wt 133 lb 6.4 oz (60.5 kg)   SpO2 96%   BMI 22.90 kg/m    VITAL SIGNS:  reviewed General NAD Lungs can speak in complete sentences and no SOB Psych pleasant affect Neuro alert and oriented X 3 MAE follows commands  ASSESSMENT & PLAN:    1. HTN controlled will decrease HCTZ to 12.5 due to symptomatic hypotension, she will  continue to monitor her BP. 2. Edema stable, she wears support stockings 3.  atrial tach and PVCs on BB and no recurrence.    COVID-19 Education: The signs and symptoms of COVID-19 were discussed with the patient and how to seek care for testing (follow up with PCP or arrange E-visit).  The importance of social distancing was discussed today.  Time:   Today, I have spent 10 minutes with the patient with telehealth technology discussing the above problems.     Medication Adjustments/Labs and Tests Ordered: Current medicines are reviewed at length with the patient today.  Concerns regarding medicines are outlined above.   Tests Ordered: Orders Placed This Encounter  Procedures  . EKG 12-Lead    Medication Changes: Meds ordered this encounter  Medications  . hydrochlorothiazide (MICROZIDE) 12.5 MG capsule    Sig: Take 1 capsule (12.5 mg total) by mouth daily.    Dispense:  90 capsule    Refill:  3  . carvedilol (COREG) 6.25 MG tablet    Sig: TAKE ONE (1) TABLET BY MOUTH TWO (2) TIMES DAILY WITH A MEAL    Dispense:  180 tablet    Refill:  3    Follow Up:  In Person in 6 month(s)  Signed, Nada BoozerLaura Ingold, NP  10/13/2018 11:14 AM    [facility]

## 2018-10-13 ENCOUNTER — Encounter: Payer: Self-pay | Admitting: Cardiology

## 2018-10-13 ENCOUNTER — Ambulatory Visit (INDEPENDENT_AMBULATORY_CARE_PROVIDER_SITE_OTHER): Payer: Medicare Other | Admitting: Cardiology

## 2018-10-13 ENCOUNTER — Other Ambulatory Visit: Payer: Self-pay

## 2018-10-13 VITALS — BP 118/70 | HR 57 | Ht 64.0 in | Wt 133.4 lb

## 2018-10-13 DIAGNOSIS — I471 Supraventricular tachycardia: Secondary | ICD-10-CM

## 2018-10-13 DIAGNOSIS — I10 Essential (primary) hypertension: Secondary | ICD-10-CM

## 2018-10-13 DIAGNOSIS — R600 Localized edema: Secondary | ICD-10-CM | POA: Diagnosis not present

## 2018-10-13 MED ORDER — HYDROCHLOROTHIAZIDE 12.5 MG PO CAPS: 12.5000 mg | ORAL_CAPSULE | Freq: Every day | ORAL | 3 refills | Status: DC

## 2018-10-13 MED ORDER — CARVEDILOL 6.25 MG PO TABS: ORAL_TABLET | ORAL | 3 refills | Status: DC

## 2018-10-13 NOTE — Patient Instructions (Signed)
Medication Instructions:  Your physician has recommended you make the following change in your medication:  1.  REDUCE the Hydrochlorothiazide to 12.5 mg daily  If you need a refill on your cardiac medications before your next appointment, please call your pharmacy.   Lab work: None orderd  If you have labs (blood work) drawn today and your tests are completely normal, you will receive your results only by: Marland Kitchen MyChart Message (if you have MyChart) OR . A paper copy in the mail If you have any lab test that is abnormal or we need to change your treatment, we will call you to review the results.  Testing/Procedures: None ordered  Follow-Up: At [facility], you and your health needs are our priority.  As part of our continuing mission to provide you with exceptional heart care, we have created designated Provider Care Teams.  These Care Teams include your primary Cardiologist (physician) and Advanced Practice Providers (APPs -  Physician Assistants and Nurse Practitioners) who all work together to provide you with the care you need, when you need it. You will need a follow up appointment in 6 months.  Please call our office 2 months in advance to schedule this appointment.  You may see Ena Dawley, MD or one of the following Advanced Practice Providers on your designated Care Team:   [place], PA-C Melina Copa, PA-C . Ermalinda Barrios, PA-C  Any Other Special Instructions Will Be Listed Below (If Applicable). Monitor your blood pressure and let us know if it increases or if you have increased edema.

## 2018-11-07 ENCOUNTER — Emergency Department (HOSPITAL_BASED_OUTPATIENT_CLINIC_OR_DEPARTMENT_OTHER)
Admission: EM | Admit: 2018-11-07 | Discharge: 2018-11-07 | Disposition: A | Discharge status: Home / Self Care | Payer: Medicare Other | Attending: Emergency Medicine | Admitting: Emergency Medicine

## 2018-11-07 ENCOUNTER — Other Ambulatory Visit: Payer: Self-pay

## 2018-11-07 ENCOUNTER — Encounter (HOSPITAL_BASED_OUTPATIENT_CLINIC_OR_DEPARTMENT_OTHER): Payer: Self-pay | Admitting: *Deleted

## 2018-11-07 DIAGNOSIS — Z888 Allergy status to other drugs, medicaments and biological substances status: Secondary | ICD-10-CM | POA: Insufficient documentation

## 2018-11-07 DIAGNOSIS — Y939 Activity, unspecified: Secondary | ICD-10-CM | POA: Diagnosis not present

## 2018-11-07 DIAGNOSIS — I10 Essential (primary) hypertension: Secondary | ICD-10-CM | POA: Insufficient documentation

## 2018-11-07 DIAGNOSIS — E785 Hyperlipidemia, unspecified: Secondary | ICD-10-CM | POA: Insufficient documentation

## 2018-11-07 DIAGNOSIS — W57XXXA Bitten or stung by nonvenomous insect and other nonvenomous arthropods, initial encounter: Secondary | ICD-10-CM | POA: Insufficient documentation

## 2018-11-07 DIAGNOSIS — S90862A Insect bite (nonvenomous), left foot, initial encounter: Secondary | ICD-10-CM | POA: Insufficient documentation

## 2018-11-07 DIAGNOSIS — Z7982 Long term (current) use of aspirin: Secondary | ICD-10-CM | POA: Insufficient documentation

## 2018-11-07 DIAGNOSIS — Y929 Unspecified place or not applicable: Secondary | ICD-10-CM | POA: Diagnosis not present

## 2018-11-07 DIAGNOSIS — Y999 Unspecified external cause status: Secondary | ICD-10-CM | POA: Insufficient documentation

## 2018-11-07 DIAGNOSIS — Z79899 Other long term (current) drug therapy: Secondary | ICD-10-CM | POA: Insufficient documentation

## 2018-11-07 DIAGNOSIS — Z87891 Personal history of nicotine dependence: Secondary | ICD-10-CM | POA: Insufficient documentation

## 2018-11-07 MED ORDER — OXYCODONE HCL 5 MG PO TABS: 5.0000 mg | ORAL_TABLET | Freq: Two times a day (BID) | ORAL | 0 refills | Status: DC | PRN

## 2018-11-07 MED ORDER — OXYCODONE HCL 5 MG PO TABS
5.0000 mg | ORAL_TABLET | Freq: Once | ORAL | Status: AC
  Administered 2018-11-07: 5 mg via ORAL
  Filled 2018-11-07: qty 1

## 2018-11-07 NOTE — ED Triage Notes (Addendum)
Spider bite to her left foot 2 days ago. Painful and swollen. She was started on Prednisone.

## 2018-11-07 NOTE — ED Notes (Signed)
ED Provider at bedside. 

## 2018-11-07 NOTE — ED Provider Notes (Signed)
[facility] EMERGENCY DEPARTMENT Provider Note   CSN: [id number] Arrival date & time: 11/07/18  1839     History   Chief Complaint Chief Complaint  Patient presents with  . Insect Bite    HPI Dawn Jimenez is a 83 y.o. female.     The history is provided by the patient.  Animal Bite Attacking animal: spider. Time since incident:  2 days Pain details:    Quality:  Aching, localized and tingling   Severity:  Moderate   Timing:  Intermittent   Progression:  Waxing and waning Tetanus status:  Up to date Relieved by:  OTC medications and prescription drugs Worsened by:  Nothing Associated symptoms: rash   Associated symptoms: no fever, no numbness and no swelling     Past Medical History:  Diagnosis Date  . Atrial tachycardia ([place])   . Hyperlipemia   . Hypertension   . Palpitations   . Seasonal allergies     Patient Active Problem List   Diagnosis Date Noted  . Acute diarrhea 03/31/2018  . Atrial tachycardia ([place]) 05/02/2017  . Dizziness 08/25/2015  . Closed displaced fracture of neck of fifth metacarpal bone of left hand with malunion 08/04/2015  . Risk for falls 07/02/2015  . Stiffness of finger joint of left hand 06/03/2015  . Closed displaced fracture of neck of fifth metacarpal bone of left hand 05/21/2015  . Dislocation of proximal interphalangeal joint of left little finger 05/21/2015  . Pain 05/21/2015  . Closed displaced fracture of shaft of fifth metacarpal bone of left hand 04/08/2015  . Dislocation of IP joint of hand, left, closed 04/08/2015  . Nonvenomous insect bite of foot 09/20/2014  . Awareness of heartbeats 06/19/2014  . Arthritis 08/21/2013  . Hyperlipidemia 08/21/2013  . Hypertension 08/21/2013  . Osteopenia 08/21/2013  . Multiple pulmonary nodules 08/30/2011  . Pulmonary nodules 08/30/2011  . Coronary artery calcification seen on CAT scan 06/07/2011  . Allergic rhinitis 05/20/2011  . History of total hysterectomy with  bilateral salpingo-oophorectomy (BSO) 05/20/2011  . Status post total abdominal hysterectomy and bilateral salpingo-oophorectomy 05/20/2011    Past Surgical History:  Procedure Laterality Date  . ABDOMINAL HYSTERECTOMY       OB History   No obstetric history on file.      Home Medications    Prior to Admission medications   Medication Sig Start Date End Date Taking? Authorizing Provider  predniSONE (DELTASONE) 10 MG tablet Take 6 pills day 1. Take 5 pills day 2. Take 4 pills day 3. Take 3 pills day 4. Take 2 pills day 5. Take 1 pill day 6. 11/07/18  Yes [provider]  aspirin 81 MG tablet Take 81 mg by mouth daily.    [provider]  atorvastatin (LIPITOR) 20 MG tablet Take 20 mg by mouth daily.    [provider]  Calcium Citrate-Vitamin D (CALCIUM + D PO) Take 1 tablet by mouth daily.    [provider]  carvedilol (COREG) 6.25 MG tablet TAKE ONE (1) TABLET BY MOUTH TWO (2) TIMES DAILY WITH A MEAL 10/13/18   Isaiah Serge, NP  celecoxib (CELEBREX) 100 MG capsule Take by mouth daily as needed. 07/14/16   [provider]  cetirizine (ZYRTEC) 10 MG tablet Take by mouth as needed. 01/03/17   [provider]  cholecalciferol (VITAMIN D) 1000 UNITS tablet Take 1,000 Units by mouth daily.    [provider]  gabapentin (NEURONTIN) 100 MG capsule as needed. 09/06/16  [provider]  hydrochlorothiazide (MICROZIDE) 12.5 MG capsule Take 1 capsule (12.5 mg total) by mouth daily. 10/13/18 01/11/19  Leone BrandIngold, Laura R, NP  lisinopril (PRINIVIL,ZESTRIL) 20 MG tablet Take 20 mg by mouth daily.    [provider]  Multiple Vitamin (MULTI-DAY VITAMINS PO) Take 1 tablet by mouth daily.    [provider]  oxyCODONE (ROXICODONE) 5 MG immediate release tablet Take 1 tablet (5 mg total) by mouth every 12 (twelve) hours as needed for up to 6 doses for severe pain. 11/07/18   Virgina Norfolkuratolo, Jacqueline Delapena, DO    Family History Family  History  Problem Relation Age of Onset  . CVA Mother   . Hypertension Mother   . Hypertension Father   . Prostate cancer Father     Social History Social History   Tobacco Use  . Smoking status: Former Games developermoker  . Smokeless tobacco: Never Used  Substance Use Topics  . Alcohol use: Yes    Alcohol/week: 0.0 standard drinks  . Drug use: No     Allergies   Suprax [cefixime] and Meperidine   Review of Systems Review of Systems  Constitutional: Negative for chills and fever.  HENT: Negative for ear pain and sore throat.   Eyes: Negative for pain and visual disturbance.  Respiratory: Negative for cough and shortness of breath.   Cardiovascular: Negative for chest pain and palpitations.  Gastrointestinal: Negative for abdominal pain and vomiting.  Genitourinary: Negative for dysuria and hematuria.  Musculoskeletal: Negative for arthralgias and back pain.  Skin: Positive for rash. Negative for color change.  Neurological: Negative for seizures, syncope and numbness.  All other systems reviewed and are negative.    Physical Exam Updated Vital Signs  ED Triage Vitals  Enc Vitals Group     BP 11/07/18 1855 (!) 153/79     Pulse Rate 11/07/18 1855 61     Resp 11/07/18 1855 16     Temp 11/07/18 1855 98.4 F (36.9 C)     Temp Source 11/07/18 1855 Oral     SpO2 11/07/18 1855 96 %     Weight 11/07/18 1852 130 lb (59 kg)     Height 11/07/18 1852 5\' 4"  (1.626 m)     Head Circumference --      Peak Flow --      Pain Score 11/07/18 1852 7     Pain Loc --      Pain Edu? --      Excl. in GC? --     Physical Exam Vitals signs and nursing note reviewed.  Constitutional:      General: She is not in acute distress.    Appearance: She is well-developed. She is not ill-appearing.  HENT:     Head: Normocephalic and atraumatic.  Neck:     Musculoskeletal: Neck supple.  Cardiovascular:     Pulses: Normal pulses.     Heart sounds: No murmur.  Pulmonary:     Effort: Pulmonary  effort is normal. No respiratory distress.     Breath sounds: Normal breath sounds.  Abdominal:     Palpations: Abdomen is soft.     Tenderness: There is no abdominal tenderness.  Skin:    General: Skin is warm and dry.     Findings: Rash (mild redness/blister to top of left lateral foot, no fluctucance or severe redness) present.  Neurological:     Mental Status: She is alert.      ED Treatments / Results  Labs (all labs  ordered are listed, but only abnormal results are displayed) Labs Reviewed - No data to display  EKG None  Radiology No results found.  Procedures Procedures (including critical care time)  Medications Ordered in ED Medications  oxyCODONE (Oxy IR/ROXICODONE) immediate release tablet 5 mg (5 mg Oral Given 11/07/18 1925)     Initial Impression / Assessment and Plan / ED Course  I have reviewed the triage vital signs and the nursing notes.  Pertinent labs & imaging results that were available during my care of the patient were reviewed by me and considered in my medical decision making (see chart for details).       Ray Giovannoni is an 83 year old female history of high cholesterol who presents to the ED with spider bite/pain in her left foot.  Patient with normal vitals.  No fever.  Patient currently on steroids and other antihistamines for spider bite to her left foot.  Overall foot is well-appearing.  There is some blistering to the lateral left foot but no signs of infectious process.  Suspect that she was bit by a spider.  She has had pain despite maximal types of pain medicine at home.  She has good pulses in the foot.  Overall suspect that this was a spider bite causing neuropathic type pain.  Will prescribe Roxicodone.  No active narcotic scripts.  Was given Roxicodone dose in the ED.  Recommend follow-up with primary care doctor and discharged from ED in good condition.  This chart was dictated using voice recognition software.  Despite best efforts  to proofread,  errors can occur which can change the documentation meaning.   Final Clinical Impressions(s) / ED Diagnoses   Final diagnoses:  Insect bite, unspecified site, initial encounter    ED Discharge Orders         Ordered    oxyCODONE (ROXICODONE) 5 MG immediate release tablet  Every 12 hours PRN     11/07/18 1937           Virgina Norfolk, DO 11/07/18 1943

## 2019-01-31 ENCOUNTER — Encounter: Payer: Self-pay | Admitting: Cardiology

## 2019-01-31 ENCOUNTER — Ambulatory Visit: Payer: Medicare Other | Admitting: Cardiology

## 2019-01-31 ENCOUNTER — Other Ambulatory Visit: Payer: Self-pay

## 2019-01-31 VITALS — BP 120/72 | HR 68 | Ht 64.0 in | Wt 135.2 lb

## 2019-01-31 DIAGNOSIS — E782 Mixed hyperlipidemia: Secondary | ICD-10-CM | POA: Diagnosis not present

## 2019-01-31 DIAGNOSIS — I10 Essential (primary) hypertension: Secondary | ICD-10-CM | POA: Diagnosis not present

## 2019-01-31 DIAGNOSIS — I471 Supraventricular tachycardia: Secondary | ICD-10-CM

## 2019-01-31 MED ORDER — LISINOPRIL 10 MG PO TABS: 10.0000 mg | ORAL_TABLET | Freq: Every day | ORAL | 2 refills | Status: DC

## 2019-01-31 NOTE — Patient Instructions (Signed)
Medication Instructions:   DECREASE YOUR LISINOPRIL TO 10 MG ONCE DAILY  *If you need a refill on your cardiac medications before your next appointment, please call your pharmacy*      Follow-Up: At [facility], you and your health needs are our priority.  As part of our continuing mission to provide you with exceptional heart care, we have created designated Provider Care Teams.  These Care Teams include your primary Cardiologist (physician) and Advanced Practice Providers (APPs -  Physician Assistants and Nurse Practitioners) who all work together to provide you with the care you need, when you need it.  Your next appointment:   12 month(s)  The format for your next appointment:   In Person  Provider:   Ena Dawley, MD

## 2019-01-31 NOTE — Progress Notes (Signed)
Cardiology Office Note    Date:  01/31/2019   ID:  Dawn Jimenez, DOB 1935/04/09, MRN [id number]  PCP:  Javier Glazier, MD  Cardiologist: Ena Dawley, MD  No chief complaint on file.  History of Present Illness:  Dawn Jimenez is a 83 y.o. female with history of hypertension, hyperlipidemia, nonsustained atrial tachycardia on Holter monitor done by [facility] treated with Toprol.  She felt fatigue on this and was switched to Coreg 6.25 mg twice daily.  Last seen by Dr. Meda Jimenez 12/2015 and was doing well.  2D echo 2015 with mild aortic valve sclerosis, trace of AI and TR normal LVEF 55%.  05/03/2017 - she comes in today for yearly follow-up.  She is walking 4 miles a day.  She denies any chest pain, palpitations, dyspnea, dyspnea on exertion, dizziness or presyncope.  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  She took some gabapentin for hip pain.  Her heart rate is 49 in the office and she says it has been running a little slower in the past week.  Last EKG heart rate was 56.  She keeps track of her blood pressure and it has been stable.  All blood work is done by primary care.  03/20/2018 -the patient is coming after year, she has been doing well personally denies any chest pain shortness of breath, he only has minimal lower extremity edema for which she wears compression socks, her only complaint is that she has been through a lot of stress with her husband having with recent hospital admission for respiratory failure.  Her blood pressure has been elevated.  She states that she has not been taking hydrochlorothiazide.  01/31/2019 - 1 year follow up, she is feeling great, walking daily without any symptoms. Palpitation have resolved, LE edema has resolved with HCTZ. She gets occasional orthostatic hypotension with BP < 100 mmHg.   Past Medical History:  Diagnosis Date  . Atrial  tachycardia ([place])   . Hyperlipemia   . Hypertension   . Palpitations   . Seasonal allergies    Past Surgical History:  Procedure Laterality Date  . ABDOMINAL HYSTERECTOMY     Current Medications: Current Meds  Medication Sig  . aspirin 81 MG tablet Take 81 mg by mouth daily.  Marland Kitchen atorvastatin (LIPITOR) 20 MG tablet Take 20 mg by mouth daily.  . Calcium Citrate-Vitamin D (CALCIUM + D PO) Take 1 tablet by mouth daily.  . carvedilol (COREG) 6.25 MG tablet TAKE ONE (1) TABLET BY MOUTH TWO (2) TIMES DAILY WITH A MEAL  . celecoxib (CELEBREX) 100 MG capsule Take by mouth daily as needed.  . cetirizine (ZYRTEC) 10 MG tablet Take by mouth as needed.  . cholecalciferol (VITAMIN D) 1000 UNITS tablet Take 2,000 Units by mouth daily.   Marland Kitchen gabapentin (NEURONTIN) 100 MG capsule Take 100 mg by mouth at bedtime.   . Multiple Vitamin (MULTI-DAY VITAMINS PO) Take 1 tablet by mouth daily.  Marland Kitchen UNABLE TO FIND HCTZ 10 mg 1 capsule by mouth daily  . [DISCONTINUED] lisinopril (PRINIVIL,ZESTRIL) 20 MG tablet Take 20 mg by mouth daily.     Allergies:   Suprax [cefixime] and Meperidine   Social History   Socioeconomic History  . Marital status: Married    Spouse name: Not on file  . Number of children: Not on file  . Years of education: Not on  file  . Highest education level: Not on file  Occupational History  . Not on file  Social Needs  . Financial resource strain: Not on file  . Food insecurity    Worry: Not on file    Inability: Not on file  . Transportation needs    Medical: Not on file    Non-medical: Not on file  Tobacco Use  . Smoking status: Former Games developer  . Smokeless tobacco: Never Used  Substance and Sexual Activity  . Alcohol use: Yes    Alcohol/week: 0.0 standard drinks  . Drug use: No  . Sexual activity: Not on file  Lifestyle  . Physical activity    Days per week: Not on file    Minutes per session: Not on file  . Stress: Not on file  Relationships  . Social Wellsite geologist on phone: Not on file    Gets together: Not on file    Attends religious service: Not on file    Active member of club or organization: Not on file    Attends meetings of clubs or organizations: Not on file    Relationship status: Not on file  Other Topics Concern  . Not on file  Social History Narrative  . Not on file    Family History:  The patient's family history includes CVA in her mother; Hypertension in her father and mother; Prostate cancer in her father.   ROS:   Please see the history of present illness.    Review of Systems  Constitution: Negative.  HENT: Negative.   Eyes: Negative.   Cardiovascular: Negative.   Respiratory: Negative.   Hematologic/Lymphatic: Negative.   Musculoskeletal: Positive for arthritis, joint pain, myalgias and stiffness.  Gastrointestinal: Negative.   Genitourinary: Negative.   Neurological: Negative.   All other systems reviewed and are negative.  PHYSICAL EXAM:   VS:  BP 120/72   Pulse 68   Ht 5\' 4"  (1.626 m)   Wt 135 lb 3.2 oz (61.3 kg)   SpO2 99%   BMI 23.21 kg/m   Physical Exam  GEN: Well nourished, well developed, in no acute distress  Neck: no JVD, carotid bruits, or masses Cardiac:RRR; normal S1 and S2, 1/6 systolic murmur at the left sternal border Respiratory:  clear to auscultation bilaterally, normal work of breathing GI: soft, nontender, nondistended, + BS Ext: without cyanosis, clubbing, or edema, Good distal pulses bilaterally Neuro:  Alert and Oriented x 3 Psych: euthymic mood, full affect  Wt Readings from Last 3 Encounters:  01/31/19 135 lb 3.2 oz (61.3 kg)  11/07/18 130 lb (59 kg)  10/13/18 133 lb 6.4 oz (60.5 kg)   Studies/Labs Reviewed:   EKG:  EKG is  ordered today.  The ekg ordered today demonstrates sinus bradycardia at 49 bpm with sinus arrhythmia.  Recent Labs: No results found for requested labs within last 8760 hours.   Lipid Panel No results found for: CHOL, TRIG, HDL, CHOLHDL, VLDL,  LDLCALC, LDLDIRECT  Additional studies/ records that were reviewed today include:  2D echo from 2015 reviewed see above    ASSESSMENT:    No diagnosis found.   PLAN:  In order of problems listed above:  Atrial tachycardia on low-dose Coreg, asymptomatic.  Hyperlipidemia managed by primary care and due for labs in April, she tolerates Lipitor well.  This is per primary prevention.  Hypertension -she gets orthostatic hypotension with BP < 100 mmHG, I will decrease lisinopril to 10 mg po daily.  LE edema - resolved with minimal dose of hydrochlorothiazide daily.  Medication Adjustments/Labs and Tests Ordered: Current medicines are reviewed at length with the patient today.  Concerns regarding medicines are outlined above.  Medication changes, Labs and Tests ordered today are listed in the Patient Instructions below. Patient Instructions  Medication Instructions:   DECREASE YOUR LISINOPRIL TO 10 MG ONCE DAILY  *If you need a refill on your cardiac medications before your next appointment, please call your pharmacy*      Follow-Up: At [facility], you and your health needs are our priority.  As part of our continuing mission to provide you with exceptional heart care, we have created designated Provider Care Teams.  These Care Teams include your primary Cardiologist (physician) and Advanced Practice Providers (APPs -  Physician Assistants and Nurse Practitioners) who all work together to provide you with the care you need, when you need it.  Your next appointment:   12 month(s)  The format for your next appointment:   In Person  Provider:   Tobias AlexanderKatarina Cameran Ahmed, MD       Signed, Tobias AlexanderKatarina Diyari Cherne, MD  01/31/2019 12:01 PM    [facility] [address], [place], [place]  [address] Phone: [phone]; Fax: [phone]

## 2019-03-13 ENCOUNTER — Other Ambulatory Visit: Payer: Self-pay

## 2019-03-13 ENCOUNTER — Encounter: Payer: Self-pay | Admitting: Physician Assistant

## 2019-03-13 ENCOUNTER — Ambulatory Visit: Payer: Medicare PPO | Admitting: Physician Assistant

## 2019-03-13 ENCOUNTER — Telehealth: Payer: Self-pay | Admitting: Cardiology

## 2019-03-13 VITALS — BP 138/64 | HR 65 | Ht 64.0 in | Wt 136.8 lb

## 2019-03-13 DIAGNOSIS — I10 Essential (primary) hypertension: Secondary | ICD-10-CM | POA: Diagnosis not present

## 2019-03-13 MED ORDER — LISINOPRIL 20 MG PO TABS: 20.0000 mg | ORAL_TABLET | Freq: Every day | ORAL | 3 refills | Status: DC

## 2019-03-13 NOTE — Patient Instructions (Signed)
Medication Instructions:   Your physician has recommended you make the following change in your medication:   1) Increase your Lisinopril to 20MG , 1 tablet by mouth once a day 2) Continue HCTZ 12.5MG , 1 tablet by mouth once a day unless your blood pressure is over 140 on the diastolic tomorrow, then take 25MG  of HCTZ.  *If you need a refill on your cardiac medications before your next appointment, please call your pharmacy*  Lab Work:  None ordered today  If you have labs (blood work) drawn today and your tests are completely normal, you will receive your results only by: MyChart Message (if you have MyChart) OR . A paper copy in the mail If you have any lab test that is abnormal or we need to change your treatment, we will call you to review the results.  Testing/Procedures:  None ordered today  Follow-Up: At [facility], you and your health needs are our priority.  As part of our continuing mission to provide you with exceptional heart care, we have created designated Provider Care Teams.  These Care Teams include your primary Cardiologist (physician) and Advanced Practice Providers (APPs -  Physician Assistants and Nurse Practitioners) who all work together to provide you with the care you need, when you need it.  Your next appointment:   12 month(s)  The format for your next appointment:   In Person  Provider:   You may see Marland Kitchen, MD or one of the following Advanced Practice Providers on your designated Care Team:    [facility], PA-C  Tobias Alexander, PA-C

## 2019-03-13 NOTE — Progress Notes (Signed)
Cardiology Office Note:    Date:  03/13/2019   ID:  Dawn Jimenez, DOB 1936-01-12, MRN [id number]  PCP:  Javier Glazier, MD  Cardiologist:  Ena Dawley, MD  Electrophysiologist:  None   Referring MD: Javier Glazier, MD   Chief Complaint  Patient presents with  . Follow-up    HTN    History of Present Illness:    Dawn Jimenez is a 84 y.o. female with:   Hypertension  Hyperlipidemia  Nonsustained atrial tachycardia  Coronary calcification on CT in 2013  Lower extremity swelling  [name] was last seen by Dr. Meda Coffee in December 2020.  She had been placed on a low-dose of hydrochlorothiazide which improved her lower extremity swelling.  However, she was having some orthostatic hypotension with blood pressure <100 mmHg with standing.  Therefore, her lisinopril was decreased to 10 mg daily.   She called in today with concerns about her BP.  It has been running higher (170/86).  She has also been having HAs.  She increased her Lisinopril back to 20 mg once daily.    She is here alone.  Her BP had been doing well on Lisinopril 10 mg and HCTZ 12.5 mg until last week.  She always has a HA on top of her head when her BP is up.  She awoke last night with a HA.  She thinks the food provided to her at [facility] is high in salt.  Her activity has decreased due to COVID-19 restrictions.  She is under stress keeping up with current events.  She has not had chest pain, shortness of breath, syncope, orthopnea, significant edema.  She has not had any focal deficits, visual changes, bleeding.     Prior CV studies:   The following studies were reviewed today:  Holter monitor 12/2013 [facility]) Sinus rhythm, PACs, PVCs, short runs (greatest 6 beats) of SVT  Echocardiogram 07/2013 [facility]) EF 55, mild MAC, trace MR, aortic sclerosis, trace to mild AI, trace TR, normal pulmonary pressures  Chest CT WO contrast 05/25/2011 [facility]) 1. Multiple  subcentimeter pulmonary nodules are nonspecific in etiology. They may simply reflect benign disease. At this point, I recommend a followup chest CT in one year for continued surveillance. 2. Coronary artery disease  Past Medical History:  Diagnosis Date  . Atrial tachycardia ([place])   . Hyperlipemia   . Hypertension   . Palpitations   . Seasonal allergies    Surgical Hx: The patient  has a past surgical history that includes Abdominal hysterectomy.   Current Medications: Current Meds  Medication Sig  . aspirin 81 MG tablet Take 81 mg by mouth daily.  Marland Kitchen atorvastatin (LIPITOR) 20 MG tablet Take 20 mg by mouth daily.  . Calcium Citrate-Vitamin D (CALCIUM + D PO) Take 1 tablet by mouth daily.  . carvedilol (COREG) 6.25 MG tablet TAKE ONE (1) TABLET BY MOUTH TWO (2) TIMES DAILY WITH A MEAL  . cholecalciferol (VITAMIN D) 1000 UNITS tablet Take 2,000 Units by mouth daily.   Marland Kitchen gabapentin (NEURONTIN) 100 MG capsule Take 100 mg by mouth at bedtime.   . hydrochlorothiazide (MICROZIDE) 12.5 MG capsule Take 1 capsule (12.5 mg total) by mouth daily.  Marland Kitchen lisinopril (ZESTRIL) 20 MG tablet Take 1 tablet (20 mg total) by mouth daily.  Marland Kitchen terbinafine (LAMISIL) 250 MG tablet Take 250 mg by mouth daily.  Marland Kitchen triamcinolone (NASACORT) 55 MCG/ACT AERO nasal inhaler SMARTSIG:1-2 Spray(s) Both Nares Daily  . [DISCONTINUED] lisinopril (ZESTRIL) 10  MG tablet Take 1 tablet (10 mg total) by mouth daily.     Allergies:   Suprax [cefixime] and Meperidine   Social History   Tobacco Use  . Smoking status: Former Research scientist (life sciences)  . Smokeless tobacco: Never Used  Substance Use Topics  . Alcohol use: Yes    Alcohol/week: 0.0 standard drinks  . Drug use: No     Family Hx: The patient's family history includes CVA in her mother; Hypertension in her father and mother; Prostate cancer in her father.  ROS:   Please see the history of present illness.    ROS All other systems reviewed and are negative.   EKGs/Labs/Other  Test Reviewed:    EKG:  EKG is not ordered today.  The ekg ordered today demonstrates na/ ECG performed 10/13/2018 was personally reviewed and demonstrated sinus bradycardia, heart rate 57, normal axis, no ST-T wave changes  Recent Labs: No results found for requested labs within last 8760 hours.    Recent Lipid Panel No results found for: CHOL, TRIG, HDL, CHOLHDL, LDLCALC, LDLDIRECT  Physical Exam:    VS:  BP 138/64   Pulse 65   Ht _0  (1.626 m)   Wt 136 lb 12.8 oz (62.1 kg)   SpO2 97%   BMI 23.48 kg/m     Wt Readings from Last 3 Encounters:  03/13/19 136 lb 12.8 oz (62.1 kg)  01/31/19 135 lb 3.2 oz (61.3 kg)  11/07/18 130 lb (59 kg)     Physical Exam  Constitutional: She is oriented to person, place, and time. She appears well-developed and well-nourished. No distress.  HENT:  Head: Normocephalic and atraumatic.  Neck: No JVD present.  Cardiovascular: Normal rate, regular rhythm, S1 normal, S2 normal and normal heart sounds.  No murmur heard. Pulmonary/Chest: Effort normal. She has no rales.  Abdominal: Soft. There is no hepatomegaly.  Musculoskeletal:        General: No edema.     Cervical back: Neck supple.  Neurological: She is alert and oriented to person, place, and time.  Skin: Skin is warm and dry.    ASSESSMENT & PLAN:    1. Essential hypertension BP is much better controlled today.  She just started taking Lisinopril 20 mg once daily yesterday.  She has a HA usually when her BP is high.  This is not that unusual for her. She has not had any neuro deficits.  She has not had a severe or "worst HA of her life".  I think she will do fine on Lisinopril 20 mg once daily and HCTZ 12.5 mg once daily. I asked her to take HCTZ 25 mg tomorrow if her SBP is > 140.  She is a Therapist, sports.  She will keep an eye on her BP.  If it continues to run higher, she can increase her HCTZ to 25 mg every day and let us know.  She had recent comprehensive labs with her PCP.  We will request  those.  FU with Dr. Meda Coffee as planned.    Dispo:  Return in about 11 months (around 02/10/2020) for Routine Follow Up w/ Dr. Meda Coffee, in person.   Medication Adjustments/Labs and Tests Ordered: Current medicines are reviewed at length with the patient today.  Concerns regarding medicines are outlined above.  Tests Ordered: No orders of the defined types were placed in this encounter.  Medication Changes: Meds ordered this encounter  Medications  . lisinopril (ZESTRIL) 20 MG tablet    Sig: Take 1 tablet (  20 mg total) by mouth daily.    Dispense:  90 tablet    Refill:  3    Signed, Richardson Dopp, PA-C  03/13/2019 2:41 PM    [facility] [address], [place], [place]  [address] Phone: [phone]; Fax: [phone]

## 2019-03-13 NOTE — Telephone Encounter (Signed)
Pt c/o BP issue: STAT if pt c/o blurred vision, one-sided weakness or slurred speech  1. What are your last 5 BP readings?   170/86 today- 170/86, 160/86 157/86,   2. Are you having any other symptoms (ex. Dizziness, headache, blurred vision, passed out)? Pain on the top of her head, feels like she bumped her head, it woke her up out a sound sleep  3. What is your BP issue?  Blood pressure is running high

## 2019-03-13 NOTE — Telephone Encounter (Signed)
See my office note from earlier today. Tereso Newcomer, PA-C    03/13/2019 5:25 PM

## 2019-03-13 NOTE — Telephone Encounter (Signed)
Pt is calling in to make an appt with Dr. Delton See or an APP in the office for today, for complaints of elevated BP readings, and complaints of headache.  Pt states her BP has been running high over the last few days or so. Pt states that over the night last night, she was awaken out of her sleep with a headache on the top of her head.  Pt described it as "bumping your head on a cabinet type pain."  Pt states that her headache is still present, and more so dull at this time.  Pt states that her BP is currently 170/86 HR-60.  Pt states she had this checked by the nurse at [facility].  Pt states she saw Dr. Delton See recently in clinic (on 01/31/19 and Assessment and plan note below from this visit) for BP issues, where her lisinopril was decreased from 20 mg to 10 mg po daily, due to having symptomatic orthostatic hypotension when systolic was < 100.  Pt states that being her BP started elevating back up over the last 2 days, she decided to increase her lisinopril back up to 20 mg po daily, and her BP still remains elevated, with her lowest reading at 157/86.  Pt states she even took all her other cardiac meds, along with self increased lisinopril 20 mg po daily this morning, waited an hour, then went to the nurse at [facility], and her BP was at 170/86.  Pt has no other complaints like blurred vision, N/V, weakness, dizziness, pre-syncopal or syncopal episodes. Pt complains of no other neuro deficits.  Pt has no chest pain, sob, or DOE. Pt states she has no LEE.  Pt is concerned mostly with these elevated readings, and having a headache.  She states she would feel much better if someone could see her for this, being she has tried increasing her meds on her own, with no success in her numbers, along with having this ongoing headache. Scheduled the pt to come in today at 1:45 pm to see Tereso Newcomer PA-C in the office.  Advised the pt to arrive 15 mins prior to this appt, and wear her facial mask to the  appt and during the entire duration of this appt. Advised the pt to have someone drive her.  Informed the pt that I will make Scott aware of this add on by routing this communication to him. Informed the pt that I will also route this communication to Dr. Delton See as an Lorain Childes, to make her aware of this plan.  Informed the pt that Dr. Delton See is out of the office this week. Pt verbalized understanding and agrees with this plan.       PLAN:  In order of problems listed above:  Atrial tachycardia on low-dose Coreg, asymptomatic.  Hyperlipidemia managed by primary care and due for labs in April, she tolerates Lipitor well.  This is per primary prevention.  Hypertension -she gets orthostatic hypotension with BP < 100 mmHG, I will decrease lisinopril to 10 mg po daily.  LE edema -resolved with minimal dose of hydrochlorothiazide daily.

## 2019-04-20 ENCOUNTER — Other Ambulatory Visit: Payer: Self-pay | Admitting: Cardiology

## 2019-04-20 MED ORDER — HYDROCHLOROTHIAZIDE 12.5 MG PO CAPS: 12.5000 mg | ORAL_CAPSULE | Freq: Every day | ORAL | 3 refills | Status: DC

## 2019-10-22 ENCOUNTER — Other Ambulatory Visit: Payer: Self-pay | Admitting: Cardiology

## 2019-12-21 ENCOUNTER — Encounter: Payer: Self-pay | Admitting: Gastroenterology

## 2019-12-21 ENCOUNTER — Ambulatory Visit: Payer: Medicare PPO | Admitting: Gastroenterology

## 2019-12-21 VITALS — BP 128/70 | HR 76 | Ht 64.0 in | Wt 134.2 lb

## 2019-12-21 DIAGNOSIS — R1310 Dysphagia, unspecified: Secondary | ICD-10-CM

## 2019-12-21 MED ORDER — OMEPRAZOLE 20 MG PO CPDR: 20.0000 mg | DELAYED_RELEASE_CAPSULE | Freq: Every day | ORAL | 11 refills | Status: DC

## 2019-12-21 NOTE — Progress Notes (Signed)
    History of Present Illness: This is an 84 year old female with chest symptoms, dysphagia.  She is accompanied by a friend.  For about the past 6 weeks she has noted episodic problems during swallowing.  At times she will feel slight pressure on her chest on a few occasions she has had transient solid food dysphagia.  Occasionally drinking some beverages she has felt a cold sensation in her esophagus which she has previously not noted.  She states she tried Tums on a few occasions which were not helpful.  She took omeprazole on a few occasions and did not notice any benefit.  No other gastrointestinal complaints.  She provides blood work from her PCP from November 01, 2019 with a CBC unremarkable except for an elevated absolute eosinophil count at 1264 with normal range up to 500.  CMP was normal.  Current Medications, Allergies, Past Medical History, Past Surgical History, Family History and Social History were reviewed in [employer] record.   Physical Exam: General: Well developed, well nourished, no acute distress Head: Normocephalic and atraumatic Eyes:  sclerae anicteric, EOMI Ears: Normal auditory acuity Mouth: Not examined, mask on during Covid-19 pandemic Lungs: Clear throughout to auscultation Heart: Regular rate and rhythm; no murmurs, rubs or bruits Abdomen: Soft, non tender and non distended. No masses, hepatosplenomegaly or hernias noted. Normal Bowel sounds Rectal: Not done Musculoskeletal: Symmetrical with no gross deformities  Pulses:  Normal pulses noted Extremities: No clubbing, cyanosis, edema or deformities noted Neurological: Alert oriented x 4, grossly nonfocal Psychological:  Alert and cooperative. Normal mood and affect   Assessment and Recommendations:  1.  Rule out GERD, esophagitis, esophageal stricture, eosinophilic esophagitis.  Begin omeprazole 20 mg daily and follow antireflux measures.  Schedule EGD with possible biopsy and possible  dilation. The risks (including bleeding, perforation, infection, missed lesions, medication reactions and possible hospitalization or surgery if complications occur), benefits, and alternatives to endoscopy with possible biopsy and possible dilation were discussed with the patient and they consent to proceed.

## 2019-12-21 NOTE — Patient Instructions (Signed)
We have sent the following medications to your pharmacy for you to pick up at your convenience: omeprazole.   Patient advised to avoid spicy, acidic, citrus, chocolate, mints, fruit and fruit juices.  Limit the intake of caffeine, alcohol and Soda.  Don't exercise too soon after eating.  Don't lie down within 3-4 hours of eating.  Elevate the head of your bed.  You have been scheduled for an endoscopy. Please follow written instructions given to you at your visit today. If you use inhalers (even only as needed), please bring them with you on the day of your procedure.   Thank you for choosing me and [facility].  Venita Lick. Pleas Koch., MD., [facility] Graham

## 2019-12-31 ENCOUNTER — Other Ambulatory Visit: Payer: Self-pay

## 2019-12-31 ENCOUNTER — Encounter: Payer: Self-pay | Admitting: Gastroenterology

## 2019-12-31 ENCOUNTER — Ambulatory Visit (AMBULATORY_SURGERY_CENTER): Payer: Medicare PPO | Admitting: Gastroenterology

## 2019-12-31 VITALS — BP 135/71 | HR 62 | Temp 98.0°F | Resp 16 | Ht 64.0 in | Wt 134.0 lb

## 2019-12-31 DIAGNOSIS — K2289 Other specified disease of esophagus: Secondary | ICD-10-CM | POA: Diagnosis not present

## 2019-12-31 DIAGNOSIS — K219 Gastro-esophageal reflux disease without esophagitis: Secondary | ICD-10-CM

## 2019-12-31 DIAGNOSIS — R1310 Dysphagia, unspecified: Secondary | ICD-10-CM | POA: Diagnosis not present

## 2019-12-31 DIAGNOSIS — K222 Esophageal obstruction: Secondary | ICD-10-CM

## 2019-12-31 DIAGNOSIS — K2970 Gastritis, unspecified, without bleeding: Secondary | ICD-10-CM | POA: Diagnosis not present

## 2019-12-31 MED ORDER — SODIUM CHLORIDE 0.9 % IV SOLN: 500.0000 mL | Freq: Once | INTRAVENOUS | Status: DC

## 2019-12-31 MED ORDER — PANTOPRAZOLE SODIUM 40 MG PO TBEC: 40.0000 mg | DELAYED_RELEASE_TABLET | Freq: Every day | ORAL | 3 refills | Status: AC

## 2019-12-31 NOTE — Op Note (Signed)
[facility] Patient Name: Dawn Jimenez Procedure Date: 12/31/2019 8:05 AM MRN: [id number] Endoscopist: Meryl Dare , MD Age: 84 Referring MD:  Date of Birth: [birth year] Gender: Female Account #: [id number] Procedure:                Upper GI endoscopy Indications:              Dysphagia Medicines:                Monitored Anesthesia Care Procedure:                Pre-Anesthesia Assessment:                           - Prior to the procedure, a History and Physical                            was performed, and patient medications and                            allergies were reviewed. The patient's tolerance of                            previous anesthesia was also reviewed. The risks                            and benefits of the procedure and the sedation                            options and risks were discussed with the patient.                            All questions were answered, and informed consent                            was obtained. Prior Anticoagulants: The patient has                            taken no previous anticoagulant or antiplatelet                            agents. ASA Grade Assessment: II - A patient with                            mild systemic disease. After reviewing the risks                            and benefits, the patient was deemed in                            satisfactory condition to undergo the procedure.                           After obtaining informed consent, the endoscope was  passed under direct vision. Throughout the                            procedure, the patient's blood pressure, pulse, and                            oxygen saturations were monitored continuously. The                            Endoscope was introduced through the mouth, and                            advanced to the second part of duodenum. The upper                            GI endoscopy was accomplished without  difficulty.                            The patient tolerated the procedure well. Scope In: Scope Out: Findings:                 One benign-appearing, intrinsic moderate stenosis                            was found at the gastroesophageal junction. This                            stenosis measured 1.2 cm (inner diameter) x less                            than one cm (in length). The stenosis was                            traversed. A guidewire was placed and the scope was                            withdrawn. Dilations were performed with Savary                            dilators with mild resistance at 13 mm, 14 mm and                            15 mm. Scant heme on each dilator.                           The exam of the esophagus was otherwise normal.                            Biopsies obtained in the mid and distal esophagus.                           Patchy mildly erythematous mucosa without bleeding  was found in the gastric antrum. Biopsies were                            taken with a cold forceps for histology.                           A small hiatal hernia was present.                           The exam of the stomach was otherwise normal.                           The duodenal bulb and second portion of the                            duodenum were normal. Complications:            No immediate complications. Estimated Blood Loss:     Estimated blood loss was minimal. Impression:               - Benign-appearing esophageal stenosis. Dilated.                           - Otherwise normal appearing esophagus. Biopsied.                           - Erythematous mucosa in the antrum. Biopsied.                           - Small hiatal hernia.                           - Normal duodenal bulb and second portion of the                            duodenum. Recommendation:           - Patient has a contact number available for                             emergencies. The signs and symptoms of potential                            delayed complications were discussed with the                            patient. Return to normal activities tomorrow.                            Written discharge instructions were provided to the                            patient.                           - Clear liquid diet for 2 hours, then advance as  tolerated to soft diet today.                           - Resume prior diet tomorrow.                           - Continue present medications.                           - Pantoprazole 40 mg po qam, 1 year of refills.                           - Await pathology results.                           - GI office appt in 6 weeks. Meryl Dare, MD 12/31/2019 8:26:04 AM This report has been signed electronically.

## 2019-12-31 NOTE — Progress Notes (Signed)
Report given to PACU, vss 

## 2019-12-31 NOTE — Progress Notes (Signed)
Called to room to assist during endoscopic procedure.  Patient ID and intended procedure confirmed with present staff. Received instructions for my participation in the procedure from the performing physician.  

## 2019-12-31 NOTE — Patient Instructions (Signed)
Impression/Recommendations:  Hiatal hernia and Dilation diet handouts given to patient.  Resume previous diet tomorrow.  Clear liquid diet for 2 hours, then advance as tolerated to soft diet today.  Continue present medications. Pantoprazole 40 mg.every morning.  Await pathology results.  Return to GI office in 6 weeks.  YOU HAD AN ENDOSCOPIC PROCEDURE TODAY AT THE [facility]:   Refer to the procedure report that was given to you for any specific questions about what was found during the examination.  If the procedure report does not answer your questions, please call your gastroenterologist to clarify.  If you requested that your care partner not be given the details of your procedure findings, then the procedure report has been included in a sealed envelope for you to review at your convenience later.  YOU SHOULD EXPECT: Some feelings of bloating in the abdomen. Passage of more gas than usual.  Walking can help get rid of the air that was put into your GI tract during the procedure and reduce the bloating. If you had a lower endoscopy (such as a colonoscopy or flexible sigmoidoscopy) you may notice spotting of blood in your stool or on the toilet paper. If you underwent a bowel prep for your procedure, you may not have a normal bowel movement for a few days.  Please Note:  You might notice some irritation and congestion in your nose or some drainage.  This is from the oxygen used during your procedure.  There is no need for concern and it should clear up in a day or so.  SYMPTOMS TO REPORT IMMEDIATELY:  Following upper endoscopy (EGD)  Vomiting of blood or coffee ground material  New chest pain or pain under the shoulder blades  Painful or persistently difficult swallowing  New shortness of breath  Fever of 100F or higher  Black, tarry-looking stools  For urgent or emergent issues, a gastroenterologist can be reached at any hour by calling [phone]. Do not use  MyChart messaging for urgent concerns.    DIET:  We do recommend a small meal at first, but then you may proceed to your regular diet.  Drink plenty of fluids but you should avoid alcoholic beverages for 24 hours.  ACTIVITY:  You should plan to take it easy for the rest of today and you should NOT DRIVE or use heavy machinery until tomorrow (because of the sedation medicines used during the test).    FOLLOW UP: Our staff will call the number listed on your records 48-72 hours following your procedure to check on you and address any questions or concerns that you may have regarding the information given to you following your procedure. If we do not reach you, we will leave a message.  We will attempt to reach you two times.  During this call, we will ask if you have developed any symptoms of COVID 19. If you develop any symptoms (ie: fever, flu-like symptoms, shortness of breath, cough etc.) before then, please call [phone].  If you test positive for Covid 19 in the 2 weeks post procedure, please call and report this information to [place].    If any biopsies were taken you will be contacted by phone or by letter within the next 1-3 weeks.  Please call us at [phone] if you have not heard about the biopsies in 3 weeks.    SIGNATURES/CONFIDENTIALITY: You and/or your care partner have signed paperwork which will be entered into your electronic medical record.  These  signatures attest to the fact that that the information above on your After Visit Summary has been reviewed and is understood.  Full responsibility of the confidentiality of this discharge information lies with you and/or your care-partner.

## 2019-12-31 NOTE — Progress Notes (Signed)
0805 Robinul 0.1 mg IV given due large amount of secretions upon assessment.  MD made aware, vss  

## 2019-12-31 NOTE — Progress Notes (Signed)
JB - Check-in  KW - VS   

## 2020-01-02 ENCOUNTER — Telehealth: Payer: Self-pay

## 2020-01-02 NOTE — Telephone Encounter (Signed)
  Follow up Call-  Call back number 12/31/2019  Post procedure Call Back phone  # #[phone] hm  Permission to leave phone message Yes  Some recent data might be hidden     Patient questions:  Do you have a fever, pain , or abdominal swelling? No. Pain Score  0 *  Have you tolerated food without any problems? Yes.    Have you been able to return to your normal activities? Yes.    Do you have any questions about your discharge instructions: Diet   No. Medications  No. Follow up visit  No.  Do you have questions or concerns about your Care? No.  Actions: * If pain score is 4 or above: No action needed, pain <4.  1. Have you developed a fever since your procedure? no  2.   Have you had an respiratory symptoms (SOB or cough) since your procedure? no  3.   Have you tested positive for COVID 19 since your procedure no  4.   Have you had any family members/close contacts diagnosed with the COVID 19 since your procedure?  no   If yes to any of these questions please route to Laverna Peace, RN and Karlton Lemon, RN

## 2020-01-08 ENCOUNTER — Encounter: Payer: Self-pay | Admitting: Gastroenterology

## 2020-02-20 ENCOUNTER — Encounter: Payer: Self-pay | Admitting: Gastroenterology

## 2020-02-20 ENCOUNTER — Ambulatory Visit: Payer: Medicare PPO | Admitting: Gastroenterology

## 2020-02-20 VITALS — BP 130/60 | HR 72 | Ht 64.0 in | Wt 132.0 lb

## 2020-02-20 DIAGNOSIS — K222 Esophageal obstruction: Secondary | ICD-10-CM | POA: Diagnosis not present

## 2020-02-20 DIAGNOSIS — R1319 Other dysphagia: Secondary | ICD-10-CM | POA: Diagnosis not present

## 2020-02-20 DIAGNOSIS — K219 Gastro-esophageal reflux disease without esophagitis: Secondary | ICD-10-CM

## 2020-02-20 NOTE — Patient Instructions (Addendum)
Continue pantoprazole daily.   Patient advised to avoid spicy, acidic, citrus, chocolate, mints, fruit and fruit juices.  Limit the intake of caffeine, alcohol and Soda.  Don't exercise too soon after eating.  Don't lie down within 3-4 hours of eating.  Elevate the head of your bed.  Thank you for choosing me and [facility].  Venita Lick. Pleas Koch., MD., Clementeen Graham

## 2020-02-20 NOTE — Progress Notes (Signed)
    History of Present Illness: This is an 84 year old female with GERD and esophageal stricture returning for follow-up.  EGD outlined below.  Her dysphagia has resolved following dilation. No GI complaints.  We reviewed the endoscopic and biopsy findings.  EGD 12/31/2019 - Benign-appearing esophageal stenosis. Dilated to 15 mm. - Otherwise normal appearing esophagus. Biopsies - reflux changes. - Erythematous mucosa in the antrum. Biopsies - minimal inflammation. - Small hiatal hernia. - Normal duodenal bulb and second portion of the duodenum.  Current Medications, Allergies, Past Medical History, Past Surgical History, Family History and Social History were reviewed in [employer] record.   Physical Exam: General: Well developed, well nourished, no acute distress Head: Normocephalic and atraumatic Eyes:  sclerae anicteric, EOMI Ears: Normal auditory acuity Mouth: Not examined, mask on during Covid-19 pandemic Lungs: Clear throughout to auscultation Heart: Regular rate and rhythm; no murmurs, rubs or bruits Abdomen: Soft, non tender and non distended. No masses, hepatosplenomegaly or hernias noted. Normal Bowel sounds Rectal: Not done Musculoskeletal: Symmetrical with no gross deformities  Pulses:  Normal pulses noted Extremities: No clubbing, cyanosis, edema or deformities noted Neurological: Alert oriented x 4, grossly nonfocal Psychological:  Alert and cooperative. Normal mood and affect   Assessment and Recommendations:  1.  GERD with an esophageal stricture.  Dysphagia resolved following dilation.  No evidence for eosinophilic esophagitis or eosinophilic gastritis.  Continue pantoprazole 40 mg daily.  Follow antireflux measures.  Return to PCP for further evaluation of elevated absolute eosinophil count.  Addressed her questions to her satisfaction.  GI follow-up in 1 year.

## 2020-04-23 ENCOUNTER — Telehealth: Payer: Self-pay | Admitting: Cardiology

## 2020-04-23 MED ORDER — CARVEDILOL 6.25 MG PO TABS: ORAL_TABLET | ORAL | 0 refills | Status: DC

## 2020-04-23 NOTE — Telephone Encounter (Signed)
Pt's medication was sent to pt's pharmacy as requested. Confirmation received.  °

## 2020-04-23 NOTE — Telephone Encounter (Signed)
*  STAT* If patient is at the pharmacy, call can be transferred to refill team.   1. Which medications need to be refilled? (please list name of each medication and dose if known) carvedilol (COREG) 6.25 MG tablet  2. Which pharmacy/location (including street and city if local pharmacy) is medication to be sent to? [facility] - [place], [place] - [address]  3. Do they need a 30 day or 90 day supply? 90 day supply

## 2020-05-09 ENCOUNTER — Other Ambulatory Visit: Payer: Self-pay | Admitting: Cardiology

## 2020-05-12 ENCOUNTER — Other Ambulatory Visit: Payer: Self-pay

## 2020-05-13 ENCOUNTER — Ambulatory Visit: Payer: Medicare PPO | Admitting: Cardiology

## 2020-05-21 NOTE — Progress Notes (Unsigned)
Cardiology Office Note:    Date:  05/23/2020   ID:  Dawn Jimenez, DOB 1935/06/04, MRN [id number]  PCP:  Javier Glazier, MD   [facility]  Cardiologist:  Ena Dawley, MD  Advanced Practice Provider:  No care team member to display Electrophysiologist:  None   Referring MD: Javier Glazier, MD    History of Present Illness:    Dawn Jimenez is a 85 y.o. female with a hx of HTN, HLD, nonsustained atrial tachycardia, and coronary calcification on CT in 2013 who was previously followed by Dr. Meda Coffee who now presents to clinic for follow-up.  Patient last saw Richardson Dopp on 03/2019 for episodes of HTN with blood pressure 170/80s. She was resumed on her lisinopril 12m daily with improvement.  Patient states that she feels overall well at home. Blood pressures are much better controlled. Has noticed increasing LE edema with L>R (left is always greater than right due to breaking her ankle on that side). Noticed this has correlated with increased intake of sodium from eating at her nursing home as she is not cooking for herself as often. She is taking extra doses of HCTZ on days where it is bad.   She also states she was recently treated an esophageal stricture with dilation as well as started on a PPI for reflux in the setting of a hiatal hernia. She continues to have intermittent reflux symptoms, but they are overall improved. She does have occasional chest discomfort radiating to her neck when she walks at a fast pace. This is rare but has been concerning to her.    Past Medical History:  Diagnosis Date  . Allergy   . Arthritis    osteoarthritis  . Atrial tachycardia ([place]   . Cataract    bil cateracts removed  . GERD (gastroesophageal reflux disease)   . Hyperlipemia   . Hypertension   . Palpitations   . Seasonal allergies     Past Surgical History:  Procedure Laterality Date  . ABDOMINAL HYSTERECTOMY    . ADENOIDECTOMY    . BREAST MASS  EXCISION    . CATARACT EXTRACTION    . COLONOSCOPY    . DILATION AND CURETTAGE OF UTERUS    . FOOT SURGERY Right   . TONSILLECTOMY    . WISDOM TOOTH EXTRACTION      Current Medications: Current Meds  Medication Sig  . aspirin 81 MG tablet Take 81 mg by mouth daily.  .Marland Kitchenatorvastatin (LIPITOR) 20 MG tablet Take 20 mg by mouth daily.  . Calcium Carb-Cholecalciferol (CALCIUM PLUS VITAMIN D3 PO) Take 2,000 mcg by mouth daily.  . carvedilol (COREG) 6.25 MG tablet TAKE ONE TABLET BY MOUTH TWICE A DAY WITH MEALS. Please keep upcoming appt in March 2022 before anymore refills. Thank you  . cyclobenzaprine (FLEXERIL) 5 MG tablet Take 5 mg by mouth 3 (three) times daily as needed for muscle spasms.  .Marland Kitchengabapentin (NEURONTIN) 100 MG capsule Take 100 mg by mouth at bedtime.   . hydrochlorothiazide (HYDRODIURIL) 25 MG tablet Take 1 tablet (25 mg total) by mouth daily.  .Marland Kitchenlisinopril (ZESTRIL) 20 MG tablet Take 1 tablet (20 mg total) by mouth daily.  . pantoprazole (PROTONIX) 40 MG tablet Take 1 tablet (40 mg total) by mouth daily. Take one 40 mg. Tablet by mouth each morning.  . [DISCONTINUED] hydrochlorothiazide (MICROZIDE) 12.5 MG capsule TAKE ONE CAPSULE BY MOUTH DAILY     Allergies:   Suprax [cefixime] and Meperidine  Social History   Socioeconomic History  . Marital status: Married    Spouse name: Not on file  . Number of children: Not on file  . Years of education: Not on file  . Highest education level: Not on file  Occupational History  . Not on file  Tobacco Use  . Smoking status: Former Smoker    Packs/day: 0.25    Years: 6.00    Pack years: 1.50    Types: Cigarettes    Quit date: 1963    Years since quitting: 59.2  . Smokeless tobacco: Never Used  Vaping Use  . Vaping Use: Never used  Substance and Sexual Activity  . Alcohol use: Yes    Alcohol/week: 7.0 standard drinks    Types: 4 Glasses of wine, 3 Shots of liquor per week    Comment: occasionally  . Drug use: No  .  Sexual activity: Not on file  Other Topics Concern  . Not on file  Social History Narrative  . Not on file   Social Determinants of Health   Financial Resource Strain: Not on file  Food Insecurity: Not on file  Transportation Needs: Not on file  Physical Activity: Not on file  Stress: Not on file  Social Connections: Not on file     Family History: The patient's family history includes CVA in her mother; Hypertension in her father and mother; Prostate cancer in her father. There is no history of Colon cancer, Stomach cancer, Liver disease, Esophageal cancer, Pancreatic cancer, or Rectal cancer.  ROS:   Please see the history of present illness.    Review of Systems  Constitutional: Negative for chills, diaphoresis and fever.  HENT: Negative for hearing loss and sore throat.   Eyes: Negative for blurred vision and redness.  Respiratory: Negative for shortness of breath.   Cardiovascular: Positive for chest pain and leg swelling. Negative for palpitations, orthopnea, claudication and PND.  Gastrointestinal: Positive for heartburn. Negative for melena, nausea and vomiting.  Genitourinary: Negative for dysuria and flank pain.  Musculoskeletal: Negative for falls.  Neurological: Negative for dizziness and loss of consciousness.  Endo/Heme/Allergies: Negative for polydipsia.  Psychiatric/Behavioral: Negative for substance abuse.    EKGs/Labs/Other Studies Reviewed:    The following studies were reviewed today: Holter monitor 12/2013 [facility]) Sinus rhythm, PACs, PVCs, short runs (greatest 6 beats) of SVT  Echocardiogram 07/2013 [facility]) EF 55, mild MAC, trace MR, aortic sclerosis, trace to mild AI, trace TR, normal pulmonary pressures  Chest CT WO contrast 05/25/2011 [facility]) 1. Multiple subcentimeter pulmonary nodules are nonspecific in etiology. They may simply reflect benign disease. At this point, I recommend a followup chest CT in one  year for continued surveillance. 2. Coronary artery disease  EKG:  EKG is  ordered today.  The ekg ordered today demonstrates NSR with PAC; HR 60  Recent Labs: No results found for requested labs within last 8760 hours.  Recent Lipid Panel No results found for: CHOL, TRIG, HDL, CHOLHDL, VLDL, LDLCALC, LDLDIRECT    Physical Exam:    VS:  BP 120/80 (BP Location: Left Arm, Patient Position: Sitting, Cuff Size: Normal)   Pulse 60   Ht _0  (1.626 m)   Wt 128 lb (58.1 kg)   SpO2 97%   BMI 21.97 kg/m     Wt Readings from Last 3 Encounters:  05/23/20 128 lb (58.1 kg)  02/20/20 132 lb (59.9 kg)  12/31/19 134 lb (60.8 kg)     GEN:  Well nourished, well developed in no acute distress HEENT: Normal NECK: No JVD; No carotid bruits CARDIAC: RRR, no murmurs, rubs, gallops RESPIRATORY:  Clear to auscultation without rales, wheezing or rhonchi  ABDOMEN: Soft, non-tender, non-distended MUSCULOSKELETAL:  1+ pedal edema; warm SKIN: Warm and dry NEUROLOGIC:  Alert and oriented x 3 PSYCHIATRIC:  Normal affect   ASSESSMENT:    1. Other chest pain   2. Essential hypertension   3. Atrial tachycardia ([place])   4. Mixed hyperlipidemia   5. Bilateral edema of lower extremity   6. Coronary artery disease involving native coronary artery of native heart without angina pectoris    PLAN:    In order of problems listed above:  #Chest Pain: Reports episodes of chest discomfort radiating to her neck when walking at a fast pace. While symptoms are rare and not frequent, concerned about anginal equivalent. Noted to have coronary calcium on CT chest. Will obtain stress test. -Patient does not want to be COVID tested or undergo quarantine; okay for lexiscan  #LE edema: Worsened since she has started eating meals at her nursing home rather than cooking for herself. No associated Sob, orthopnea, PND. Will treat symptomatically by increasing HCTZ and compression socks. -Increase HCTZ to 50m  daily -Continue compression socks -Low Na diet  #HTN: Controlled but with increasing LE edema. Will trial higher dose of HCTZ. -Coreg 6.249mBID -Increase HCTZ to 2563maily for LE edema -Lisinopril 79m57mily  #HLD: -Continue lipitor 79mg16mly -Check lipids at time of stress test  #Nonsustained atrial tachycardia: -Continue coreg 6.25mg 77m #Coronary calcification on CT: -Continue ASA 81mg -32minue lipitor 79mg da74m Shared Decision Making/Informed Consent The risks [chest pain, shortness of breath, cardiac arrhythmias, dizziness, blood pressure fluctuations, myocardial infarction, stroke/transient ischemic attack, nausea, vomiting, allergic reaction, radiation exposure, metallic taste sensation and life-threatening complications (estimated to be 1 in 10,000)], benefits (risk stratification, diagnosing coronary artery disease, treatment guidance) and alternatives of a nuclear stress test were discussed in detail with Ms. Deniston and she agrees to proceed.    Medication Adjustments/Labs and Tests Ordered: Current medicines are reviewed at length with the patient today.  Concerns regarding medicines are outlined above.  Orders Placed This Encounter  Procedures  . Lipid Profile  . Myocardial Perfusion Imaging  . EKG 12-Lead   Meds ordered this encounter  Medications  . hydrochlorothiazide (HYDRODIURIL) 25 MG tablet    Sig: Take 1 tablet (25 mg total) by mouth daily.    Dispense:  90 tablet    Refill:  3    Patient Instructions  Medication Instructions:  Increase your HCTZ to 25 mg a day.  Your physician recommends that you continue on your current medications as directed. Please refer to the Current Medication list given to you today.  *If you need a refill on your cardiac medications before your next appointment, please call your pharmacy*   Lab Work: Lipids day of your stress test  If you have labs (blood work) drawn today and your tests are completely normal, you  will receive your results only by: . MyCharMarland Kitchen Message (if you have MyChart) OR . A paper copy in the mail If you have any lab test that is abnormal or we need to change your treatment, we will call you to review the results.   Testing/Procedures:  Your physician has requested that you have a lexiscan myoview. For further information please visit [email or web address] follow instruction sheet, as given.    Follow-Up: At [facility], you and your health needs are our priority.  As part of our continuing mission to provide you with exceptional heart care, we have created designated Provider Care Teams.  These Care Teams include your primary Cardiologist (physician) and Advanced Practice Providers (APPs -  Physician Assistants and Nurse Practitioners) who all work together to provide you with the care you need, when you need it.  We recommend signing up for the patient portal called "MyChart".  Sign up information is provided on this After Visit Summary.  MyChart is used to connect with patients for Virtual Visits (Telemedicine).  Patients are able to view lab/test results, encounter notes, upcoming appointments, etc.  Non-urgent messages can be sent to your provider as well.   To learn more about what you can do with MyChart, go to [email or web address].    Your next appointment:   1 year(s)  The format for your next appointment:   In Person  Provider:   Gwyndolyn Kaufman, MD       Signed, Freada Bergeron, MD  05/23/2020 10:43 AM    [place]

## 2020-05-23 ENCOUNTER — Other Ambulatory Visit: Payer: Self-pay

## 2020-05-23 ENCOUNTER — Encounter: Payer: Self-pay | Admitting: Cardiology

## 2020-05-23 ENCOUNTER — Ambulatory Visit: Payer: Medicare PPO | Admitting: Cardiology

## 2020-05-23 VITALS — BP 120/80 | HR 60 | Ht 64.0 in | Wt 128.0 lb

## 2020-05-23 DIAGNOSIS — E782 Mixed hyperlipidemia: Secondary | ICD-10-CM | POA: Diagnosis not present

## 2020-05-23 DIAGNOSIS — R0789 Other chest pain: Secondary | ICD-10-CM

## 2020-05-23 DIAGNOSIS — I10 Essential (primary) hypertension: Secondary | ICD-10-CM | POA: Diagnosis not present

## 2020-05-23 DIAGNOSIS — I2510 Atherosclerotic heart disease of native coronary artery without angina pectoris: Secondary | ICD-10-CM

## 2020-05-23 DIAGNOSIS — I471 Supraventricular tachycardia: Secondary | ICD-10-CM

## 2020-05-23 DIAGNOSIS — R600 Localized edema: Secondary | ICD-10-CM

## 2020-05-23 MED ORDER — HYDROCHLOROTHIAZIDE 25 MG PO TABS: 25.0000 mg | ORAL_TABLET | Freq: Every day | ORAL | 3 refills | Status: DC

## 2020-05-23 NOTE — Patient Instructions (Addendum)
Medication Instructions:  Increase your HCTZ to 25 mg a day.  Your physician recommends that you continue on your current medications as directed. Please refer to the Current Medication list given to you today.  *If you need a refill on your cardiac medications before your next appointment, please call your pharmacy*   Lab Work: Lipids day of your stress test  If you have labs (blood work) drawn today and your tests are completely normal, you will receive your results only by: Marland Kitchen MyChart Message (if you have MyChart) OR . A paper copy in the mail If you have any lab test that is abnormal or we need to change your treatment, we will call you to review the results.   Testing/Procedures:  Your physician has requested that you have a lexiscan myoview. For further information please visit [email or web address] Please follow instruction sheet, as given.    Follow-Up: At [facility], you and your health needs are our priority.  As part of our continuing mission to provide you with exceptional heart care, we have created designated Provider Care Teams.  These Care Teams include your primary Cardiologist (physician) and Advanced Practice Providers (APPs -  Physician Assistants and Nurse Practitioners) who all work together to provide you with the care you need, when you need it.  We recommend signing up for the patient portal called "MyChart".  Sign up information is provided on this After Visit Summary.  MyChart is used to connect with patients for Virtual Visits (Telemedicine).  Patients are able to view lab/test results, encounter notes, upcoming appointments, etc.  Non-urgent messages can be sent to your provider as well.   To learn more about what you can do with MyChart, go to [email or web address].    Your next appointment:   1 year(s)  The format for your next appointment:   In Person  Provider:   Laurance Flatten, MD

## 2020-05-26 NOTE — Addendum Note (Signed)
Addended by: Loa Socks on: 05/26/2020 11:41 AM   Modules accepted: Orders

## 2020-05-26 NOTE — Addendum Note (Signed)
Addended by: Laurance Flatten on: 05/26/2020 01:12 PM   Modules accepted: Orders

## 2020-05-27 ENCOUNTER — Telehealth (HOSPITAL_COMMUNITY): Payer: Self-pay

## 2020-05-29 ENCOUNTER — Encounter (HOSPITAL_COMMUNITY): Payer: Self-pay

## 2020-05-29 ENCOUNTER — Encounter (HOSPITAL_COMMUNITY): Payer: Medicare PPO

## 2020-05-29 ENCOUNTER — Other Ambulatory Visit: Payer: Medicare PPO

## 2020-06-02 ENCOUNTER — Other Ambulatory Visit: Payer: Self-pay | Admitting: Physician Assistant

## 2020-06-09 ENCOUNTER — Encounter (HOSPITAL_COMMUNITY): Payer: Medicare PPO

## 2020-06-17 NOTE — Telephone Encounter (Signed)
RE: pt ready to schedule myoview per pemberton Received: Today Loma Sender, LPN Good Morning [place]! Patient is scheduled for 06/23/20. ;)    Pt scheduled for myoview on 06/23/20.  Pt made aware of appt date and time by [facility] Scheduling.

## 2020-06-18 ENCOUNTER — Telehealth (HOSPITAL_COMMUNITY): Payer: Self-pay | Admitting: *Deleted

## 2020-06-18 NOTE — Telephone Encounter (Signed)
Patient given detailed instructions per Myocardial Perfusion Study Information Sheet for the test on 06/23/20 at 10:15. Patient notified to arrive 15 minutes early and that it is imperative to arrive on time for appointment to keep from having the test rescheduled.  If you need to cancel or reschedule your appointment, please call the office within 24 hours of your appointment. . Patient verbalized understanding.Dawn Jimenez

## 2020-06-23 ENCOUNTER — Ambulatory Visit (HOSPITAL_COMMUNITY): Payer: Medicare PPO | Attending: Cardiology

## 2020-06-23 ENCOUNTER — Other Ambulatory Visit: Payer: Self-pay

## 2020-06-23 DIAGNOSIS — R0789 Other chest pain: Secondary | ICD-10-CM

## 2020-06-23 LAB — MYOCARDIAL PERFUSION IMAGING
LV dias vol: 46 mL (ref 46–106)
LV sys vol: 9 mL
Peak HR: 103 {beats}/min
Rest HR: 65 {beats}/min
SDS: 1
SRS: 0
SSS: 1
TID: 0.9

## 2020-06-23 LAB — LIPID PANEL
Chol/HDL Ratio: 2.9 ratio (ref 0.0–4.4)
Cholesterol, Total: 173 mg/dL (ref 100–199)
HDL: 60 mg/dL (ref >39)
LDL Chol Calc (NIH): 93 mg/dL (ref 0–99)
Triglycerides: 111 mg/dL (ref 0–149)
VLDL Cholesterol Cal: 20 mg/dL (ref 5–40)

## 2020-06-23 MED ORDER — TECHNETIUM TC 99M TETROFOSMIN IV KIT
31.6000 | PACK | Freq: Once | INTRAVENOUS | Status: AC | PRN
  Administered 2020-06-23: 31.6 via INTRAVENOUS
  Filled 2020-06-23: qty 32

## 2020-06-23 MED ORDER — TECHNETIUM TC 99M TETROFOSMIN IV KIT
10.0000 | PACK | Freq: Once | INTRAVENOUS | Status: AC | PRN
Start: 2020-06-23 — End: 2020-06-23
  Administered 2020-06-23: 10 via INTRAVENOUS
  Filled 2020-06-23: qty 10

## 2020-06-23 MED ORDER — REGADENOSON 0.4 MG/5ML IV SOLN
0.4000 mg | Freq: Once | INTRAVENOUS | Status: AC
  Administered 2020-06-23: 0.4 mg via INTRAVENOUS

## 2020-06-24 ENCOUNTER — Telehealth: Payer: Self-pay | Admitting: *Deleted

## 2020-06-24 MED ORDER — ATORVASTATIN CALCIUM 40 MG PO TABS: 40.0000 mg | ORAL_TABLET | Freq: Every day | ORAL | 1 refills | Status: DC

## 2020-06-24 NOTE — Telephone Encounter (Signed)
-----   Message from Quintella Reichert, MD sent at 06/24/2020 11:34 AM EDT ----- LDL goal < 70 - increase lipitor to 40mg  daily and repeat FLP and ALT in 6 weeks

## 2020-06-24 NOTE — Telephone Encounter (Signed)
Pt made aware of lab results and recommendations per Dr. Mayford Knife, covering for Dr. Shari Prows. Pt aware to increase lipitor to 40 mg po daily and have repeat FLP and ALT done in 6 weeks to evaluate this.  Confirmed the pharmacy of choice with the pt.  Pt states she will have her PCP there in her [facility] facility draw her labs to recheck lipids/ALT done in 6 weeks, for she states she is not interested in commuting all the way to our office to have this done.  Offered to provide her contact information to send the results to, but pt states she will handle this on her end and get them back to [place] once completed. Pt verbalized understanding and agrees with this plan.

## 2020-08-06 ENCOUNTER — Other Ambulatory Visit: Payer: Self-pay

## 2020-08-06 MED ORDER — CARVEDILOL 6.25 MG PO TABS: ORAL_TABLET | ORAL | 3 refills | Status: DC

## 2021-06-27 NOTE — Progress Notes (Deleted)
?Cardiology Office Note:   ? ?Date:  06/27/2021  ? ?ID:  Dawn Jimenez, DOB 01-13-1936, MRN [id number] ? ?PCP:  Javier Glazier, MD ?  ?[place]  ?Cardiologist:  Ena Dawley, MD  ?Advanced Practice Provider:  No care team member to display ?Electrophysiologist:  None  ? ?Referring MD: Javier Glazier, MD  ? ? ?History of Present Illness:   ? ?Dawn Jimenez is a 86 y.o. female with a hx of HTN, HLD, nonsustained atrial tachycardia, and coronary calcification on CT in 2013 who was previously followed by Dr. Meda Coffee who now presents to clinic for follow-up. ? ?Was last seen in clinic on 04/2020 where she was doing well from a CV standpoint. She had an esophageal stricture that was treated with dilation and PPI. Was also having chest pain with exertion. We obtained a myoview on 05/2020 which was normal with EF 79%.  ? ?Today, *** ? ? ?Past Medical History:  ?Diagnosis Date  ? Allergy   ? Arthritis   ? osteoarthritis  ? Atrial tachycardia ([place])   ? Cataract   ? bil cateracts removed  ? GERD (gastroesophageal reflux disease)   ? Hyperlipemia   ? Hypertension   ? Palpitations   ? Seasonal allergies   ? ? ?Past Surgical History:  ?Procedure Laterality Date  ? ABDOMINAL HYSTERECTOMY    ? ADENOIDECTOMY    ? BREAST MASS EXCISION    ? CATARACT EXTRACTION    ? COLONOSCOPY    ? DILATION AND CURETTAGE OF UTERUS    ? FOOT SURGERY Right   ? TONSILLECTOMY    ? WISDOM TOOTH EXTRACTION    ? ? ?Current Medications: ?No outpatient medications have been marked as taking for the 07/01/21 encounter (Appointment) with Freada Bergeron, MD.  ?  ? ?Allergies:   Suprax [cefixime] and Meperidine  ? ?Social History  ? ?Socioeconomic History  ? Marital status: Married  ?  Spouse name: Not on file  ? Number of children: Not on file  ? Years of education: Not on file  ? Highest education level: Not on file  ?Occupational History  ? Not on file  ?Tobacco Use  ? Smoking status: Former  ?  Packs/day: 0.25  ?  Years:  6.00  ?  Pack years: 1.50  ?  Types: Cigarettes  ?  Quit date: 1963  ?  Years since quitting: 60.3  ? Smokeless tobacco: Never  ?Vaping Use  ? Vaping Use: Never used  ?Substance and Sexual Activity  ? Alcohol use: Yes  ?  Alcohol/week: 7.0 standard drinks  ?  Types: 4 Glasses of wine, 3 Shots of liquor per week  ?  Comment: occasionally  ? Drug use: No  ? Sexual activity: Not on file  ?Other Topics Concern  ? Not on file  ?Social History Narrative  ? Not on file  ? ?Social Determinants of Health  ? ?Financial Resource Strain: Not on file  ?Food Insecurity: Not on file  ?Transportation Needs: Not on file  ?Physical Activity: Not on file  ?Stress: Not on file  ?Social Connections: Not on file  ?  ? ?Family History: ?The patient's family history includes CVA in her mother; Hypertension in her father and mother; Prostate cancer in her father. There is no history of Colon cancer, Stomach cancer, Liver disease, Esophageal cancer, Pancreatic cancer, or Rectal cancer. ? ?ROS:   ?Please see the history of present illness.    ?Review of Systems  ?Constitutional:  Negative for chills, diaphoresis and fever.  ?HENT:  Negative for hearing loss and sore throat.   ?Eyes:  Negative for blurred vision and redness.  ?Respiratory:  Negative for shortness of breath.   ?Cardiovascular:  Positive for chest pain and leg swelling. Negative for palpitations, orthopnea, claudication and PND.  ?Gastrointestinal:  Positive for heartburn. Negative for melena, nausea and vomiting.  ?Genitourinary:  Negative for dysuria and flank pain.  ?Musculoskeletal:  Negative for falls.  ?Neurological:  Negative for dizziness and loss of consciousness.  ?Endo/Heme/Allergies:  Negative for polydipsia.  ?Psychiatric/Behavioral:  Negative for substance abuse.   ? ?EKGs/Labs/Other Studies Reviewed:   ? ?The following studies were reviewed today: ?Myoview 05/2020: ?Nuclear stress EF: 79%. ?The left ventricular ejection fraction is hyperdynamic (>65%). ?There was  no ST segment deviation noted during stress. ?The study is normal. ?This is a low risk study. ? ?Holter monitor 12/2013 [facility]) ?Sinus rhythm, PACs, PVCs, short runs (greatest 6 beats) of SVT ?  ?Echocardiogram 07/2013 [facility]) ?EF 55, mild MAC, trace MR, aortic sclerosis, trace to mild AI, trace TR, normal pulmonary pressures ?  ?Chest CT WO contrast 05/25/2011 [facility]) ?1. Multiple subcentimeter pulmonary nodules are nonspecific in etiology. They may simply reflect benign disease. At this point, I recommend a followup chest CT in one year for continued surveillance. ?2. Coronary artery disease ? ?EKG:  EKG is  ordered today.  The ekg ordered today demonstrates NSR with PAC; HR 60 ? ?Recent Labs: ?No results found for requested labs within last 8760 hours.  ?Recent Lipid Panel ?   ?Component Value Date/Time  ? CHOL 173 06/23/2020 1037  ? TRIG 111 06/23/2020 1037  ? HDL 60 06/23/2020 1037  ? CHOLHDL 2.9 06/23/2020 1037  ? [place] 93 06/23/2020 1037  ? ? ? ? ?Physical Exam:   ? ?VS:  There were no vitals taken for this visit.   ? ?Wt Readings from Last 3 Encounters:  ?06/23/20 128 lb (58.1 kg)  ?05/23/20 128 lb (58.1 kg)  ?02/20/20 132 lb (59.9 kg)  ?  ? ?GEN:  Well nourished, well developed in no acute distress ?HEENT: Normal ?NECK: No JVD; No carotid bruits ?CARDIAC: RRR, no murmurs, rubs, gallops ?RESPIRATORY:  Clear to auscultation without rales, wheezing or rhonchi  ?ABDOMEN: Soft, non-tender, non-distended ?MUSCULOSKELETAL:  1+ pedal edema; warm ?SKIN: Warm and dry ?NEUROLOGIC:  Alert and oriented x 3 ?PSYCHIATRIC:  Normal affect  ? ?ASSESSMENT:   ? ?No diagnosis found. ? ?PLAN:   ? ?In order of problems listed above: ? ?#Chest Pain: ?Reassuring work-up with normal myoview in 05/2020. Likely related to reflux in the setting of esophageal stricture requiring dilation.  ? ?#LE edema: ?Improved since lowering sodium in her diet.  ?-Continue HCTZ 71m daily ?-Continue compression  socks ?-Low Na diet ? ?#HTN: ?Controlled but with increasing LE edema. Will trial higher dose of HCTZ. ?-Coreg 6.257mBID ?-Increase HCTZ to 2547maily for LE edema ?-Lisinopril 60m29mily ? ?#HLD: ?-Continue lipitor 60mg70mly ?-Check lipids at time of stress test ? ?#Nonsustained atrial tachycardia: ?-Continue coreg 6.25mg 93m? ?#Coronary calcification on CT: ?-Continue ASA 81mg ?74mtinue lipitor 60mg da55m? ?Shared Decision Making/Informed Consent ?The risks [chest pain, shortness of breath, cardiac arrhythmias, dizziness, blood pressure fluctuations, myocardial infarction, stroke/transient ischemic attack, nausea, vomiting, allergic reaction, radiation exposure, metallic taste sensation and life-threatening complications (estimated to be 1 in 10,000)], benefits (risk stratification, diagnosing coronary artery disease, treatment guidance) and alternatives of a nuclear stress test  were discussed in detail with Ms. Raczynski and she agrees to proceed. ? ? ? ?Medication Adjustments/Labs and Tests Ordered: ?Current medicines are reviewed at length with the patient today.  Concerns regarding medicines are outlined above.  ?No orders of the defined types were placed in this encounter. ? ?No orders of the defined types were placed in this encounter. ? ? ?There are no Patient Instructions on file for this visit. ?  ? ?Signed, ?Freada Bergeron, MD  ?06/27/2021 3:12 PM    ?[place] ?

## 2021-07-01 ENCOUNTER — Encounter: Payer: Self-pay | Admitting: Cardiology

## 2021-07-01 ENCOUNTER — Ambulatory Visit: Payer: Medicare PPO | Admitting: Cardiology

## 2021-07-01 VITALS — BP 140/78 | HR 55 | Ht 64.0 in | Wt 124.4 lb

## 2021-07-01 DIAGNOSIS — I10 Essential (primary) hypertension: Secondary | ICD-10-CM | POA: Diagnosis not present

## 2021-07-01 DIAGNOSIS — E782 Mixed hyperlipidemia: Secondary | ICD-10-CM

## 2021-07-01 DIAGNOSIS — R600 Localized edema: Secondary | ICD-10-CM

## 2021-07-01 DIAGNOSIS — I471 Supraventricular tachycardia: Secondary | ICD-10-CM

## 2021-07-01 DIAGNOSIS — I2510 Atherosclerotic heart disease of native coronary artery without angina pectoris: Secondary | ICD-10-CM

## 2021-07-01 DIAGNOSIS — Z79899 Other long term (current) drug therapy: Secondary | ICD-10-CM | POA: Diagnosis not present

## 2021-07-01 MED ORDER — ROSUVASTATIN CALCIUM 20 MG PO TABS: 20.0000 mg | ORAL_TABLET | Freq: Every day | ORAL | 3 refills | Status: DC

## 2021-07-01 MED ORDER — CARVEDILOL 6.25 MG PO TABS: ORAL_TABLET | ORAL | 3 refills | Status: DC

## 2021-07-01 NOTE — Patient Instructions (Signed)
Medication Instructions:  ? ?STOP TAKING ATORVASTATIN (LIPITOR) NOW ? ?START TAKING ROSUVASTATIN (CRESTOR) 20 MG BY MOUTH DAILY ? ?*If you need a refill on your cardiac medications before your next appointment, please call your pharmacy* ? ? ?Lab Work: ? ?IN 6-8 WEEKS HERE IN THE OFFICE--CHECK LIPIDS--COME FASTING TO THIS LAB APPOINTMENT ? ?If you have labs (blood work) drawn today and your tests are completely normal, you will receive your results only by: ?MyChart Message (if you have MyChart) OR ?A paper copy in the mail ?If you have any lab test that is abnormal or we need to change your treatment, we will call you to review the results. ? ? ? ?Follow-Up: ?At [facility], you and your health needs are our priority.  As part of our continuing mission to provide you with exceptional heart care, we have created designated Provider Care Teams.  These Care Teams include your primary Cardiologist (physician) and Advanced Practice Providers (APPs -  Physician Assistants and Nurse Practitioners) who all work together to provide you with the care you need, when you need it. ? ?We recommend signing up for the patient portal called "MyChart".  Sign up information is provided on this After Visit Summary.  MyChart is used to connect with patients for Virtual Visits (Telemedicine).  Patients are able to view lab/test results, encounter notes, upcoming appointments, etc.  Non-urgent messages can be sent to your provider as well.   ?To learn more about what you can do with MyChart, go to [email or web address].   ? ?Your next appointment:   ?1 year(s) ? ?The format for your next appointment:   ?In Person ? ?Provider:   ?DR. PEMBERTON ? ?Important Information About Sugar ? ? ? ? ? ? ?

## 2021-07-01 NOTE — Progress Notes (Signed)
?Cardiology Office Note:   ? ?Date:  07/01/2021  ? ?ID:  Prudy Feeler, DOB [birth year], MRN [id number] ? ?PCP:  Javier Glazier, MD ?  ?[place]  ?Cardiologist:  Ena Dawley, MD  ?Advanced Practice Provider:  No care team member to display ?Electrophysiologist:  None  ? ?Referring MD: Javier Glazier, MD  ? ? ?History of Present Illness:   ? ?Lisa Milian is a 86 y.o. female with a hx of HTN, HLD, nonsustained atrial tachycardia, and coronary calcification on CT in 2013 who was previously followed by Dr. Meda Coffee who now presents to clinic for follow-up. ? ?Was last seen in clinic on 04/2020 where she was doing well from a CV standpoint. She had an esophageal stricture that was treated with dilation and PPI. Was also having chest pain with exertion. We obtained a myoview on 05/2020 which was normal with EF 79%.  ? ?Today, the patient states that she believes she is doing okay. It has been a rough year for her; at the end of October 03, 2020 her husband passed away. Offered my condolences. She also reports having a small hiatal hernia and a stricture dilation. Currently she is on pantoprazole. She denies any chest discomfort. ? ?Also, she has been suffering from headaches for several weeks. Yesterday she had a head CT which was notable for extensive chronic small vessel ischemia. She has an appointment with her PCP later this week to discuss these results. ? ?She does endorse some swelling in her legs if she does not wear her compression socks. Today she is wearing compression and denies edema at this time. ? ?At home her blood pressure normally ranges from 110-120/60-70. ? ?For activity she continues to work up to walking 4 miles a day. She also participates in an exercise class every morning. Generally she feels well while exercising with no anginal symptoms. ? ?Recent lab work was reviewed today. This showed total cholesterol 161, HDL 61, and LDL 83. She has been hesitant to increase her  statin dose due to concern for increased side effects. ? ?She denies any palpitations, or shortness of breath. No lightheadedness, syncope, orthopnea, or PND. ? ?In her family, her mother had multi-infarct dementia. ? ?Past Medical History:  ?Diagnosis Date  ? Allergy   ? Arthritis   ? osteoarthritis  ? Atrial tachycardia ([place])   ? Cataract   ? bil cateracts removed  ? GERD (gastroesophageal reflux disease)   ? Hyperlipemia   ? Hypertension   ? Palpitations   ? Seasonal allergies   ? ? ?Past Surgical History:  ?Procedure Laterality Date  ? ABDOMINAL HYSTERECTOMY    ? ADENOIDECTOMY    ? BREAST MASS EXCISION    ? CATARACT EXTRACTION    ? COLONOSCOPY    ? DILATION AND CURETTAGE OF UTERUS    ? FOOT SURGERY Right   ? TONSILLECTOMY    ? WISDOM TOOTH EXTRACTION    ? ? ?Current Medications: ?Current Meds  ?Medication Sig  ? aspirin 81 MG tablet Take 81 mg by mouth daily.  ? Azelastine HCl (ASTEPRO NA) Place into the nose.  ? Calcium Carb-Cholecalciferol (CALCIUM PLUS VITAMIN D3 PO) Take 2,000 mcg by mouth daily.  ? cyclobenzaprine (FLEXERIL) 5 MG tablet Take 5 mg by mouth 3 (three) times daily as needed for muscle spasms.  ? fluticasone (FLONASE) 50 MCG/ACT nasal spray Place into both nostrils daily.  ? gabapentin (NEURONTIN) 100 MG capsule Take 100 mg by mouth at bedtime.   ?  hydrochlorothiazide (HYDRODIURIL) 25 MG tablet Take 1 tablet (25 mg total) by mouth daily.  ? lisinopril (ZESTRIL) 20 MG tablet TAKE 1 TABLET BY MOUTH DAILY  ? pantoprazole (PROTONIX) 40 MG tablet Take 1 tablet (40 mg total) by mouth daily. Take one 40 mg. Tablet by mouth each morning.  ? rosuvastatin (CRESTOR) 20 MG tablet Take 1 tablet (20 mg total) by mouth daily.  ? [DISCONTINUED] atorvastatin (LIPITOR) 40 MG tablet Take 1 tablet (40 mg total) by mouth daily.  ? [DISCONTINUED] carvedilol (COREG) 6.25 MG tablet TAKE ONE TABLET BY MOUTH TWICE A DAY WITH MEALS.  ?  ? ?Allergies:   Suprax [cefixime] and Meperidine  ? ?Social History  ? ?Socioeconomic  History  ? Marital status: Married  ?  Spouse name: Not on file  ? Number of children: Not on file  ? Years of education: Not on file  ? Highest education level: Not on file  ?Occupational History  ? Not on file  ?Tobacco Use  ? Smoking status: Former  ?  Packs/day: 0.25  ?  Years: 6.00  ?  Pack years: 1.50  ?  Types: Cigarettes  ?  Quit date: 1963  ?  Years since quitting: 60.3  ? Smokeless tobacco: Never  ?Vaping Use  ? Vaping Use: Never used  ?Substance and Sexual Activity  ? Alcohol use: Yes  ?  Alcohol/week: 7.0 standard drinks  ?  Types: 4 Glasses of wine, 3 Shots of liquor per week  ?  Comment: occasionally  ? Drug use: No  ? Sexual activity: Not on file  ?Other Topics Concern  ? Not on file  ?Social History Narrative  ? Not on file  ? ?Social Determinants of Health  ? ?Financial Resource Strain: Not on file  ?Food Insecurity: Not on file  ?Transportation Needs: Not on file  ?Physical Activity: Not on file  ?Stress: Not on file  ?Social Connections: Not on file  ?  ? ?Family History: ?The patient's family history includes CVA in her mother; Hypertension in her father and mother; Prostate cancer in her father. There is no history of Colon cancer, Stomach cancer, Liver disease, Esophageal cancer, Pancreatic cancer, or Rectal cancer. ? ?ROS:   ?Please see the history of present illness.    ?Review of Systems  ?Constitutional:  Negative for chills, diaphoresis and fever.  ?HENT:  Negative for hearing loss and sore throat.   ?Eyes:  Negative for blurred vision and redness.  ?Respiratory:  Negative for shortness of breath.   ?Cardiovascular:  Positive for leg swelling. Negative for chest pain, palpitations, orthopnea, claudication and PND.  ?Gastrointestinal:  Positive for heartburn. Negative for melena, nausea and vomiting.  ?Genitourinary:  Negative for dysuria and flank pain.  ?Musculoskeletal:  Negative for falls.  ?Neurological:  Positive for headaches. Negative for dizziness and loss of consciousness.   ?Endo/Heme/Allergies:  Negative for polydipsia.  ?Psychiatric/Behavioral:  Negative for substance abuse.   ? ?EKGs/Labs/Other Studies Reviewed:   ? ?The following studies were reviewed today: ? ?CT Head 06/30/2021 ([place]): ?FINDINGS:  ?Brain: No evidence of acute infarction, hemorrhage, hydrocephalus,  ?extra-axial collection or mass lesion/mass effect. Low-density  ?spaces below the left more than right putamen, most consistent with  ?dilated perivascular spaces, although there are chronic lacunar  ?infarcts at least at the left caudate head. Chronic small vessel  ?ischemic gliosis in the cerebral white matter which is extensive.  ?Mild for age cerebral volume loss.  ? ?Vascular: No hyperdense vessel or  unexpected calcification.  ? ?Skull: Normal. Negative for fracture or focal lesion.  ? ?Sinuses/Orbits: No acute finding.  ? ?IMPRESSION:  ?1. No evidence of recent insult.  ?2. Extensive chronic small vessel ischemia.  ? ?Myoview 05/2020: ?Nuclear stress EF: 79%. ?The left ventricular ejection fraction is hyperdynamic (>65%). ?There was no ST segment deviation noted during stress. ?The study is normal. ?This is a low risk study. ? ?Holter monitor 12/2013 [facility]) ?Sinus rhythm, PACs, PVCs, short runs (greatest 6 beats) of SVT ?  ?Echocardiogram 07/2013 [facility]) ?EF 55, mild MAC, trace MR, aortic sclerosis, trace to mild AI, trace TR, normal pulmonary pressures ?  ?Chest CT WO contrast 05/25/2011 [facility]) ?1. Multiple subcentimeter pulmonary nodules are nonspecific in etiology. They may simply reflect benign disease. At this point, I recommend a followup chest CT in one year for continued surveillance. ?2. Coronary artery disease ? ?EKG:  EKG is personally reviewed. ?07/01/2020: Sinus bradycardia. Rate 55 bpm. ?05/23/2020: NSR with PAC; HR 60 ? ?Recent Labs: ?No results found for requested labs within last 8760 hours.  ? ?Recent Lipid Panel ?   ?Component Value Date/Time  ?  CHOL 173 06/23/2020 1037  ? TRIG 111 06/23/2020 1037  ? HDL 60 06/23/2020 1037  ? CHOLHDL 2.9 06/23/2020 1037  ? [place] 93 06/23/2020 1037  ? ? ? ? ?Physical Exam:   ? ?VS:  BP 140/78   Pulse (!) 55   Ht 5'

## 2021-07-31 ENCOUNTER — Ambulatory Visit (HOSPITAL_COMMUNITY)
Admission: RE | Admit: 2021-07-31 | Discharge: 2021-07-31 | Disposition: A | Discharge status: Home / Self Care | Payer: Medicare PPO | Source: Ambulatory Visit | Attending: Orthopaedic Surgery | Admitting: Orthopaedic Surgery

## 2021-07-31 ENCOUNTER — Other Ambulatory Visit (HOSPITAL_COMMUNITY): Payer: Self-pay | Admitting: Orthopaedic Surgery

## 2021-07-31 DIAGNOSIS — M79605 Pain in left leg: Secondary | ICD-10-CM

## 2021-08-25 ENCOUNTER — Other Ambulatory Visit: Payer: Medicare PPO

## 2021-08-25 DIAGNOSIS — I2510 Atherosclerotic heart disease of native coronary artery without angina pectoris: Secondary | ICD-10-CM

## 2021-08-25 DIAGNOSIS — E782 Mixed hyperlipidemia: Secondary | ICD-10-CM

## 2021-08-25 DIAGNOSIS — Z79899 Other long term (current) drug therapy: Secondary | ICD-10-CM

## 2021-08-25 LAB — LIPID PANEL
Chol/HDL Ratio: 2.3 ratio (ref 0.0–4.4)
Cholesterol, Total: 139 mg/dL (ref 100–199)
HDL: 61 mg/dL (ref >39)
LDL Chol Calc (NIH): 64 mg/dL (ref 0–99)
Triglycerides: 69 mg/dL (ref 0–149)
VLDL Cholesterol Cal: 14 mg/dL (ref 5–40)

## 2021-08-27 ENCOUNTER — Other Ambulatory Visit: Payer: Self-pay | Admitting: *Deleted

## 2021-08-27 MED ORDER — HYDROCHLOROTHIAZIDE 25 MG PO TABS: 25.0000 mg | ORAL_TABLET | Freq: Every day | ORAL | 3 refills | Status: AC

## 2021-12-31 ENCOUNTER — Encounter (HOSPITAL_BASED_OUTPATIENT_CLINIC_OR_DEPARTMENT_OTHER): Payer: Self-pay | Admitting: Emergency Medicine

## 2021-12-31 ENCOUNTER — Emergency Department (HOSPITAL_BASED_OUTPATIENT_CLINIC_OR_DEPARTMENT_OTHER)
Admission: EM | Admit: 2021-12-31 | Discharge: 2022-01-01 | Disposition: A | Discharge status: Home / Self Care | Payer: Medicare PPO | Attending: Emergency Medicine | Admitting: Emergency Medicine

## 2021-12-31 ENCOUNTER — Emergency Department (HOSPITAL_BASED_OUTPATIENT_CLINIC_OR_DEPARTMENT_OTHER): Payer: Medicare PPO

## 2021-12-31 ENCOUNTER — Other Ambulatory Visit: Payer: Self-pay

## 2021-12-31 DIAGNOSIS — I6789 Other cerebrovascular disease: Secondary | ICD-10-CM | POA: Diagnosis not present

## 2021-12-31 DIAGNOSIS — W19XXXA Unspecified fall, initial encounter: Secondary | ICD-10-CM | POA: Diagnosis not present

## 2021-12-31 DIAGNOSIS — Z7982 Long term (current) use of aspirin: Secondary | ICD-10-CM | POA: Diagnosis not present

## 2021-12-31 DIAGNOSIS — S0993XA Unspecified injury of face, initial encounter: Secondary | ICD-10-CM | POA: Diagnosis present

## 2021-12-31 DIAGNOSIS — S01511A Laceration without foreign body of lip, initial encounter: Secondary | ICD-10-CM

## 2021-12-31 NOTE — ED Triage Notes (Signed)
Mechanical fall at home today. States she tripped over her shoe. Hit face on the floor. Pt has laceration to bottom lip. Denies blood thinners, loc, facial pain, neck/hip/back pain.

## 2022-01-01 MED ORDER — LIDOCAINE-EPINEPHRINE (PF) 2 %-1:200000 IJ SOLN
20.0000 mL | Freq: Once | INTRAMUSCULAR | Status: AC
  Administered 2022-01-01: 20 mL
  Filled 2022-01-01: qty 20

## 2022-01-01 NOTE — ED Provider Notes (Signed)
[facility] EMERGENCY DEPARTMENT Provider Note   CSN: [id number] Arrival date & time: 12/31/21  2120     History  Chief Complaint  Patient presents with   Fall   Laceration    Lower Lip    Dawn Jimenez is a 86 y.o. female.  The history is provided by the patient.  Fall This is a new problem. The current episode started less than 1 hour ago. The problem occurs constantly. The problem has not changed since onset.Pertinent negatives include no chest pain, no abdominal pain, no headaches and no shortness of breath. Nothing aggravates the symptoms. Nothing relieves the symptoms. She has tried nothing for the symptoms. The treatment provided no relief.  Laceration Location:  Face Facial laceration location:  Lower lip Depth:  Through dermis Quality: jagged   Bleeding: controlled   Laceration mechanism:  Fall Pain details:    Quality:  Aching Foreign body present:  No foreign bodies Relieved by:  Nothing Worsened by:  Nothing Ineffective treatments:  None tried Associated symptoms: no fever        Home Medications Prior to Admission medications   Medication Sig Start Date End Date Taking? Authorizing Provider  aspirin 81 MG tablet Take 81 mg by mouth daily.    [provider]  Azelastine HCl (ASTEPRO NA) Place into the nose.    [provider]  Calcium Carb-Cholecalciferol (CALCIUM PLUS VITAMIN D3 PO) Take 2,000 mcg by mouth daily.    [provider]  carvedilol (COREG) 6.25 MG tablet TAKE ONE TABLET BY MOUTH TWICE A DAY WITH MEALS. 07/01/21   Freada Bergeron, MD  cyclobenzaprine (FLEXERIL) 5 MG tablet Take 5 mg by mouth 3 (three) times daily as needed for muscle spasms.    [provider]  fluticasone (FLONASE) 50 MCG/ACT nasal spray Place into both nostrils daily.    [provider]  gabapentin (NEURONTIN) 100 MG capsule Take 100 mg by mouth at bedtime.  09/06/16   [provider]  hydrochlorothiazide  (HYDRODIURIL) 25 MG tablet Take 1 tablet (25 mg total) by mouth daily. 08/27/21   Freada Bergeron, MD  lisinopril (ZESTRIL) 20 MG tablet TAKE 1 TABLET BY MOUTH DAILY 06/02/20   Richardson Dopp T, PA-C  pantoprazole (PROTONIX) 40 MG tablet Take 1 tablet (40 mg total) by mouth daily. Take one 40 mg. Tablet by mouth each morning. 12/31/19   Ladene Artist, MD  rosuvastatin (CRESTOR) 20 MG tablet Take 1 tablet (20 mg total) by mouth daily. 07/01/21   Freada Bergeron, MD      Allergies    Cefixime and Meperidine    Review of Systems   Review of Systems  Constitutional:  Negative for fever.  Respiratory:  Negative for shortness of breath.   Cardiovascular:  Negative for chest pain.  Gastrointestinal:  Negative for abdominal pain.  Neurological:  Negative for headaches.  All other systems reviewed and are negative. 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  Patient is on a baby ASA.    Physical Exam Updated Vital Signs Ht 5\' 4"  (1.626 m)   Wt 54.4 kg   BMI 20.60 kg/m  Physical Exam Vitals and nursing note reviewed.  Constitutional:      General: She is not in acute distress.    Appearance: Normal appearance. She is well-developed.  HENT:     Head: Normocephalic.     Jaw: No trismus.  Nose: Nose normal.     Mouth/Throat:     Mouth: Mucous membranes are moist.  Eyes:     Pupils: Pupils are equal, round, and reactive to light.  Cardiovascular:     Rate and Rhythm: Normal rate and regular rhythm.     Pulses: Normal pulses.     Heart sounds: Normal heart sounds.  Pulmonary:     Effort: Pulmonary effort is normal. No respiratory distress.     Breath sounds: Normal breath sounds.  Abdominal:     General: Bowel sounds are normal. There is no distension.     Palpations: Abdomen is soft.     Tenderness: There is no abdominal tenderness. There is no guarding or rebound.  Genitourinary:    Vagina: No vaginal  discharge.  Musculoskeletal:        General: Normal range of motion.     Cervical back: Normal range of motion.  Skin:    General: Skin is warm and dry.     Capillary Refill: Capillary refill takes less than 2 seconds.     Findings: No erythema or rash.  Neurological:     General: No focal deficit present.     Mental Status: She is alert.     Deep Tendon Reflexes: Reflexes normal.  Psychiatric:        Mood and Affect: Mood normal.        Behavior: Behavior normal.     ED Results / Procedures / Treatments   Labs (all labs ordered are listed, but only abnormal results are displayed) Labs Reviewed - No data to display  EKG None  Radiology No results found.  Procedures .Marland KitchenLaceration Repair  Date/Time: 01/01/2022 1:29 AM  Performed by: Cy Blamer, MD Authorized by: Cy Blamer, MD   Consent:    Consent obtained:  Verbal   Consent given by:  Patient   Risks discussed:  Infection, need for additional repair, nerve damage, pain, poor cosmetic result and poor wound healing Universal protocol:    Patient identity confirmed:  Arm band Anesthesia:    Anesthesia method:  Local infiltration   Local anesthetic:  Lidocaine 1% WITH epi Laceration details:    Location:  Lip   Lip location:  Lower interior lip and lower exterior lip   Length (cm):  2.9   Depth (mm):  1 Pre-procedure details:    Preparation:  Patient was prepped and draped in usual sterile fashion Exploration:    Hemostasis achieved with:  Direct pressure   Wound extent: fascia not violated, no foreign body and no signs of injury     Contaminated: no   Treatment:    Wound cleansed with: alcohol.   Debridement:  None   Undermining:  None   Scar revision: no   Skin repair:    Repair method:  Sutures   Suture size:  6-0   Wound skin closure material used: vicryl rapide.   Suture technique:  Simple interrupted   Number of sutures:  7 (5 internal 2 external) Approximation:    Approximation:   Close Repair type:    Repair type:  Intermediate Post-procedure details:    Dressing:  Open (no dressing)   Procedure completion:  Tolerated well, no immediate complications     Medications Ordered in ED Medications - No data to display  ED Course/ Medical Decision Making/ A&P  Medical Decision Making Patient fell striking lower lip on ground   Amount and/or Complexity of Data Reviewed Independent Historian: friend    Details: See above  External Data Reviewed: notes.    Details: Previous notes reviewed  Radiology: ordered and independent interpretation performed.    Details: Negative head CT by me   Risk Prescription drug management. Risk Details: No seeds or popcorn no submerging wound in a body of water x 2 weeks.  These are absorbable suture so no need for removal.     } Final Clinical Impression(s) / ED Diagnoses Final diagnoses:  None   Return for intractable cough, coughing up blood, fevers > 100.4 unrelieved by medication, shortness of breath, intractable vomiting, chest pain, shortness of breath, weakness, numbness, changes in speech, facial asymmetry, abdominal pain, passing out, Inability to tolerate liquids or food, cough, altered mental status or any concerns. No signs of systemic illness or infection. The patient is nontoxic-appearing on exam and vital signs are within normal limits.  I have reviewed the triage vital signs and the nursing notes. Pertinent labs & imaging results that were available during my care of the patient were reviewed by me and considered in my medical decision making (see chart for details). After history, exam, and medical workup I feel the patient has been appropriately medically screened and is safe for discharge home. Pertinent diagnoses were discussed with the patient. Patient was given return precautions.    Rx / DC Orders ED Discharge Orders     None         Raj Landress, MD 01/01/22 9826

## 2022-01-30 ENCOUNTER — Other Ambulatory Visit: Payer: Self-pay

## 2022-01-30 ENCOUNTER — Encounter (HOSPITAL_BASED_OUTPATIENT_CLINIC_OR_DEPARTMENT_OTHER): Payer: Self-pay | Admitting: Emergency Medicine

## 2022-01-30 ENCOUNTER — Emergency Department (HOSPITAL_BASED_OUTPATIENT_CLINIC_OR_DEPARTMENT_OTHER)
Admission: EM | Admit: 2022-01-30 | Discharge: 2022-01-30 | Disposition: A | Discharge status: Home / Self Care | Payer: Medicare PPO | Attending: Emergency Medicine | Admitting: Emergency Medicine

## 2022-01-30 DIAGNOSIS — K130 Diseases of lips: Secondary | ICD-10-CM | POA: Insufficient documentation

## 2022-01-30 NOTE — ED Triage Notes (Signed)
Pt arrives pov, steady gait, c/o lower lip pain. Endorses recent sutures r/t fall x 3 weeks pta.

## 2022-01-30 NOTE — ED Provider Notes (Signed)
[facility] EMERGENCY DEPARTMENT Provider Note   CSN: [id number] Arrival date & time: 01/30/22  0820     History {Add pertinent medical, surgical, social history, OB history to HPI:1} Chief Complaint  Patient presents with   Lip Pain    Dawn Jimenez is a 86 y.o. female.  Patient is an 86 year old female presenting for lip pain.  Patient states on December 31, 2021 she fell and obtained a lip laceration.  States the same day she had it repaired with absorbable sutures.  States she originally had improvement of pain however started feeling a large knot in her left lower lip where her sutures placed.  She denies any redness, warmth, or significant swelling.  Denies any difficulty swallowing or drooling.  The history is provided by the patient. No language interpreter was used.       Home Medications Prior to Admission medications   Medication Sig Start Date End Date Taking? Authorizing Provider  aspirin 81 MG tablet Take 81 mg by mouth daily.    [provider]  Azelastine HCl (ASTEPRO NA) Place into the nose.    [provider]  Calcium Carb-Cholecalciferol (CALCIUM PLUS VITAMIN D3 PO) Take 2,000 mcg by mouth daily.    [provider]  carvedilol (COREG) 6.25 MG tablet TAKE ONE TABLET BY MOUTH TWICE A DAY WITH MEALS. 07/01/21   Meriam Sprague, MD  cyclobenzaprine (FLEXERIL) 5 MG tablet Take 5 mg by mouth 3 (three) times daily as needed for muscle spasms.    [provider]  fluticasone (FLONASE) 50 MCG/ACT nasal spray Place into both nostrils daily.    [provider]  gabapentin (NEURONTIN) 100 MG capsule Take 100 mg by mouth at bedtime.  09/06/16   [provider]  hydrochlorothiazide (HYDRODIURIL) 25 MG tablet Take 1 tablet (25 mg total) by mouth daily. 08/27/21   Meriam Sprague, MD  lisinopril (ZESTRIL) 20 MG tablet TAKE 1 TABLET BY MOUTH DAILY 06/02/20   Tereso Newcomer T, PA-C  pantoprazole (PROTONIX) 40 MG  tablet Take 1 tablet (40 mg total) by mouth daily. Take one 40 mg. Tablet by mouth each morning. 12/31/19   Meryl Dare, MD  rosuvastatin (CRESTOR) 20 MG tablet Take 1 tablet (20 mg total) by mouth daily. 07/01/21   Meriam Sprague, MD      Allergies    Cefixime and Meperidine    Review of Systems   Review of Systems  Constitutional:  Negative for chills and fever.  HENT:  Negative for drooling and trouble swallowing.   Respiratory:  Negative for chest tightness and shortness of breath.     Physical Exam Updated Vital Signs BP 136/69   Pulse 63   Temp 97.7 F (36.5 C) (Oral)   Resp 18   Ht 5\' 4"  (1.626 m)   Wt 54.4 kg   SpO2 100%   BMI 20.60 kg/m  Physical Exam Vitals and nursing note reviewed.  Constitutional:      Appearance: Normal appearance.  HENT:     Head: Normocephalic and atraumatic.  Cardiovascular:     Rate and Rhythm: Normal rate.  Pulmonary:     Effort: Pulmonary effort is normal.  Neurological:     Mental Status: She is alert.     ED Results / Procedures / Treatments   Labs (all labs ordered are listed, but only abnormal results are displayed) Labs Reviewed - No data to display  EKG None  Radiology No results found.  Procedures  Procedures  {Document cardiac monitor, telemetry assessment procedure when appropriate:1}  Medications Ordered in ED Medications - No data to display  ED Course/ Medical Decision Making/ A&P                           Medical Decision Making  66:54 AM 86 year old female presenting for lip pain that developed after absorbable sutures dissolved from a lip laceration.  Patient is alert oriented x3, no acute distress, afebrile, stable vital signs.  Physical exam demonstrates localized swelling < 1 cm x < 1 cm on the bottom lip on the left side.  No localized rashes, skin sloughing, erythema, or tenderness.  Bedside ultrasound demonstrates swelling of soft tissue only.  No abscesses.  No retained suture products.   Patient recommended for close follow-up with primary care physician or dentist if symptoms do not resolve in the next month.  Patient in no distress and overall condition improved here in the ED. Detailed discussions were had with the patient regarding current findings, and need for close f/u with PCP or on call doctor. The patient has been instructed to return immediately if the symptoms worsen in any way for re-evaluation. Patient verbalized understanding and is in agreement with current care plan. All questions answered prior to discharge.   {Document critical care time when appropriate:1} {Document review of labs and clinical decision tools ie heart score, Chads2Vasc2 etc:1}  {Document your independent review of radiology images, and any outside records:1} {Document your discussion with family members, caretakers, and with consultants:1} {Document social determinants of health affecting pt's care:1} {Document your decision making why or why not admission, treatments were needed:1} Final Clinical Impression(s) / ED Diagnoses Final diagnoses:  None    Rx / DC Orders ED Discharge Orders     None

## 2022-01-30 NOTE — Discharge Instructions (Signed)
Please keep your appointment with your dentist for lip swelling follow-up.  Bedside ultrasound demonstrates no abscesses.  No retained suture material.

## 2022-06-30 NOTE — Progress Notes (Signed)
Cardiology Office Note:    Date:  07/01/2022   ID:  Dawn Jimenez, DOB September 30, 1935, MRN [id number]  PCP:  Nadara Eaton, MD   [facility]  Cardiologist:  Tobias Alexander, MD  Advanced Practice Provider:  No care team member to display Electrophysiologist:  None   Referring MD: Nadara Eaton, MD    History of Present Illness:    Dawn Jimenez is a 87 y.o. female with a hx of HTN, HLD, nonsustained atrial tachycardia, and coronary calcification on CT in 2013 who was previously followed by Dr. Delton See who now presents to clinic for follow-up.  Was seen in clinic on 04/2020 where she was doing well from a CV standpoint. She had an esophageal stricture that was treated with dilation and PPI. Was also having chest pain with exertion. We obtained a myoview on 05/2020 which was normal with EF 79%.   Was last seen in clinic on 06/2021 where she was struggling with the loss of her husband. Was doing well from a CV standpoint and was very active without exertional symptoms. We changed her lipitor to crestor with improvement in LDL cholesterol.  Today, the patient overall feels well. She states that about a week ago, she noticed her irregular heart beat, but it resolved without intervention. Since that time, she has not had any significant symptoms. No chest pain, SOB, lightheadedness, or syncope. Able to walk without exertional symptoms. Attends a workout class M, W, F with resistance training. LDL cholesterol 73 in 01.2024  Past Medical History:  Diagnosis Date   Allergy    Arthritis    osteoarthritis   Atrial tachycardia    Cataract    bil cateracts removed   GERD (gastroesophageal reflux disease)    Hyperlipemia    Hypertension    Palpitations    Seasonal allergies     Past Surgical History:  Procedure Laterality Date   ABDOMINAL HYSTERECTOMY     ADENOIDECTOMY     BREAST MASS EXCISION     CATARACT EXTRACTION     COLONOSCOPY     DILATION AND  CURETTAGE OF UTERUS     FOOT SURGERY Right    TONSILLECTOMY     WISDOM TOOTH EXTRACTION      Current Medications: Current Meds  Medication Sig   aspirin 81 MG tablet Take 81 mg by mouth daily.   Azelastine HCl (ASTEPRO NA) Place into the nose.   Calcium Carb-Cholecalciferol (CALCIUM PLUS VITAMIN D3 PO) Take 2,000 mcg by mouth daily.   carvedilol (COREG) 6.25 MG tablet TAKE ONE TABLET BY MOUTH TWICE A DAY WITH MEALS.   cyanocobalamin (VITAMIN B12) 1000 MCG tablet Take by mouth.   fluticasone (FLONASE) 50 MCG/ACT nasal spray Place into both nostrils daily.   hydrochlorothiazide (HYDRODIURIL) 25 MG tablet Take 1 tablet (25 mg total) by mouth daily.   ketoconazole (NIZORAL) 2 % shampoo Apply topically.   lisinopril (ZESTRIL) 20 MG tablet TAKE 1 TABLET BY MOUTH DAILY   loratadine (CLARITIN) 10 MG tablet Take by mouth.   pantoprazole (PROTONIX) 40 MG tablet Take 1 tablet (40 mg total) by mouth daily. Take one 40 mg. Tablet by mouth each morning.   rosuvastatin (CRESTOR) 20 MG tablet Take 1 tablet (20 mg total) by mouth daily.     Allergies:   Cefixime and Meperidine   Social History   Socioeconomic History   Marital status: Widowed    Spouse name: Not on file   Number of children: Not on  file   Years of education: Not on file   Highest education level: Not on file  Occupational History   Not on file  Tobacco Use   Smoking status: Former    Packs/day: 0.25    Years: 6.00    Additional pack years: 0.00    Total pack years: 1.50    Types: Cigarettes    Quit date: 2    Years since quitting: 61.3   Smokeless tobacco: Never  Vaping Use   Vaping Use: Never used  Substance and Sexual Activity   Alcohol use: Yes    Alcohol/week: 7.0 standard drinks of alcohol    Types: 4 Glasses of wine, 3 Shots of liquor per week    Comment: occasionally   Drug use: No   Sexual activity: Not on file  Other Topics Concern   Not on file  Social History Narrative   Not on file   Social  Determinants of Health   Financial Resource Strain: Not on file  Food Insecurity: Not on file  Transportation Needs: Not on file  Physical Activity: Not on file  Stress: Not on file  Social Connections: Not on file     Family History: The patient's family history includes CVA in her mother; Hypertension in her father and mother; Prostate cancer in her father. There is no history of Colon cancer, Stomach cancer, Liver disease, Esophageal cancer, Pancreatic cancer, or Rectal cancer.  ROS:   Please see the history of present illness.    Review of Systems  Constitutional:  Negative for chills, diaphoresis and fever.  HENT:  Negative for hearing loss and sore throat.   Eyes:  Negative for blurred vision and redness.  Respiratory:  Negative for shortness of breath.   Cardiovascular:  Positive for leg swelling. Negative for chest pain, palpitations, orthopnea, claudication and PND.  Gastrointestinal:  Negative for melena, nausea and vomiting.  Genitourinary:  Negative for dysuria and flank pain.  Musculoskeletal:  Positive for joint pain. Negative for falls.  Neurological:  Negative for dizziness and loss of consciousness.  Psychiatric/Behavioral:  Negative for substance abuse.     EKGs/Labs/Other Studies Reviewed:    The following studies were reviewed today:  CT Head 06/30/2021 ([facility]): FINDINGS:  Brain: No evidence of acute infarction, hemorrhage, hydrocephalus,  extra-axial collection or mass lesion/mass effect. Low-density  spaces below the left more than right putamen, most consistent with  dilated perivascular spaces, although there are chronic lacunar  infarcts at least at the left caudate head. Chronic small vessel  ischemic gliosis in the cerebral white matter which is extensive.  Mild for age cerebral volume loss.   Vascular: No hyperdense vessel or unexpected calcification.   Skull: Normal. Negative for fracture or focal lesion.   Sinuses/Orbits: No acute  finding.   IMPRESSION:  1. No evidence of recent insult.  2. Extensive chronic small vessel ischemia.   Myoview 05/2020: Nuclear stress EF: 79%. The left ventricular ejection fraction is hyperdynamic (>65%). There was no ST segment deviation noted during stress. The study is normal. This is a low risk study.  Holter monitor 12/2013 [facility]) Sinus rhythm, PACs, PVCs, short runs (greatest 6 beats) of SVT   Echocardiogram 07/2013 [facility]) EF 55, mild MAC, trace MR, aortic sclerosis, trace to mild AI, trace TR, normal pulmonary pressures   Chest CT WO contrast 05/25/2011 [facility]) 1. Multiple subcentimeter pulmonary nodules are nonspecific in etiology. They may simply reflect benign disease. At this point, I recommend  a followup chest CT in one year for continued surveillance. 2. Coronary artery disease  EKG:  EKG is personally reviewed.SB with sinus arrhythmia, HR 58  Recent Labs: No results found for requested labs within last 365 days.   Recent Lipid Panel    Component Value Date/Time   CHOL 139 08/25/2021 0857   TRIG 69 08/25/2021 0857   HDL 61 08/25/2021 0857   CHOLHDL 2.3 08/25/2021 0857   LDLCALC 64 08/25/2021 0857      Physical Exam:    VS:  BP 134/76   Pulse (!) 58   Ht 5\' 4"  (1.626 m)   Wt 125 lb 6.4 oz (56.9 kg)   SpO2 97%   BMI 21.52 kg/m     Wt Readings from Last 3 Encounters:  07/01/22 125 lb 6.4 oz (56.9 kg)  01/30/22 120 lb (54.4 kg)  12/31/21 120 lb (54.4 kg)     GEN:  Well nourished, well developed in no acute distress HEENT: Normal NECK: No JVD; No carotid bruits CARDIAC: RRR, 1/6 systolic murmur. No rubs or gallops RESPIRATORY:  Clear to auscultation without rales, wheezing or rhonchi  ABDOMEN: Soft, non-tender, non-distended MUSCULOSKELETAL:  Trace pedal edema; warm. Compression socks in place SKIN: Warm and dry NEUROLOGIC:  Alert and oriented x 3 PSYCHIATRIC:  Normal affect   ASSESSMENT:    1.  Coronary artery disease involving native coronary artery of native heart without angina pectoris   2. Essential hypertension   3. Atrial tachycardia   4. Mixed hyperlipidemia   5. Bilateral edema of lower extremity     PLAN:    In order of problems listed above:  #Chest Pain: Reassuring work-up with normal myoview in 05/2020. Likely related to reflux in the setting of esophageal stricture requiring dilation. Well controlled with PPI.  #LE edema: Stable and well controlled.  -Continue HCTZ 25mg  daily -Continue compression socks -Low Na diet  #HTN: Controlled and at goal <120/80s. -Coreg 6.25mg  BID -Continue HCTZ 25mg  daily -Lisinopril 20mg  daily  #HLD: Discussed that given her CT scan, she would likely benefit from a higher dose statin. If she does not tolerate, can go back down in dosing. -Continue crestor 20mg  daily  -LDL well controlled at 73  #Nonsustained atrial tachycardia: -Continue coreg 6.25mg  BID  #Coronary calcification on CT: -Continue ASA 81mg  -Continue crestor 20mg  daily  -Declined Ca score today   Follow-up:  1 year.  Medication Adjustments/Labs and Tests Ordered: Current medicines are reviewed at length with the patient today.  Concerns regarding medicines are outlined above.   Orders Placed This Encounter  Procedures   EKG 12-Lead   No orders of the defined types were placed in this encounter.  Patient Instructions  Medication Instructions:   Your physician recommends that you continue on your current medications as directed. Please refer to the Current Medication list given to you today.  *If you need a refill on your cardiac medications before your next appointment, please call your pharmacy*    Follow-Up: At [facility], you and your health needs are our priority.  As part of our continuing mission to provide you with exceptional heart care, we have created designated Provider Care Teams.  These Care Teams include your primary  Cardiologist (physician) and Advanced Practice Providers (APPs -  Physician Assistants and Nurse Practitioners) who all work together to provide you with the care you need, when you need it.  We recommend signing up for the patient portal called "MyChart".  Sign up information  is provided on this After Visit Summary.  MyChart is used to connect with patients for Virtual Visits (Telemedicine).  Patients are able to view lab/test results, encounter notes, upcoming appointments, etc.  Non-urgent messages can be sent to your provider as well.   To learn more about what you can do with MyChart, go to [email or web address].    Your next appointment:   1 year(s)  Provider:   DR. Shari Prows     Signed, Meriam Sprague, MD  07/01/2022 11:11 AM    [facility]

## 2022-07-01 ENCOUNTER — Ambulatory Visit: Payer: Medicare PPO | Attending: Cardiology | Admitting: Cardiology

## 2022-07-01 ENCOUNTER — Encounter: Payer: Self-pay | Admitting: Cardiology

## 2022-07-01 VITALS — BP 134/76 | HR 58 | Ht 64.0 in | Wt 125.4 lb

## 2022-07-01 DIAGNOSIS — I4719 Other supraventricular tachycardia: Secondary | ICD-10-CM | POA: Diagnosis not present

## 2022-07-01 DIAGNOSIS — I2510 Atherosclerotic heart disease of native coronary artery without angina pectoris: Secondary | ICD-10-CM

## 2022-07-01 DIAGNOSIS — I10 Essential (primary) hypertension: Secondary | ICD-10-CM

## 2022-07-01 DIAGNOSIS — E782 Mixed hyperlipidemia: Secondary | ICD-10-CM

## 2022-07-01 DIAGNOSIS — R600 Localized edema: Secondary | ICD-10-CM

## 2022-07-01 NOTE — Patient Instructions (Signed)
Medication Instructions:   Your physician recommends that you continue on your current medications as directed. Please refer to the Current Medication list given to you today.  *If you need a refill on your cardiac medications before your next appointment, please call your pharmacy*   Follow-Up: At [facility], you and your health needs are our priority.  As part of our continuing mission to provide you with exceptional heart care, we have created designated Provider Care Teams.  These Care Teams include your primary Cardiologist (physician) and Advanced Practice Providers (APPs -  Physician Assistants and Nurse Practitioners) who all work together to provide you with the care you need, when you need it.  We recommend signing up for the patient portal called "MyChart".  Sign up information is provided on this After Visit Summary.  MyChart is used to connect with patients for Virtual Visits (Telemedicine).  Patients are able to view lab/test results, encounter notes, upcoming appointments, etc.  Non-urgent messages can be sent to your provider as well.   To learn more about what you can do with MyChart, go to [email or web address].    Your next appointment:   1 year(s)  Provider:   DR. PEMBERTON   

## 2022-07-08 ENCOUNTER — Encounter: Payer: Self-pay | Admitting: Cardiology

## 2022-07-08 DIAGNOSIS — E782 Mixed hyperlipidemia: Secondary | ICD-10-CM

## 2022-07-08 DIAGNOSIS — Z79899 Other long term (current) drug therapy: Secondary | ICD-10-CM

## 2022-07-08 DIAGNOSIS — I2510 Atherosclerotic heart disease of native coronary artery without angina pectoris: Secondary | ICD-10-CM

## 2022-07-08 MED ORDER — ROSUVASTATIN CALCIUM 20 MG PO TABS
20.0000 mg | ORAL_TABLET | Freq: Every day | ORAL | 3 refills | Status: AC
Start: 2022-07-08 — End: ?

## 2022-08-30 ENCOUNTER — Ambulatory Visit: Payer: Medicare PPO | Admitting: Nurse Practitioner

## 2023-04-29 ENCOUNTER — Encounter: Payer: Self-pay | Admitting: Orthopaedic Surgery

## 2023-04-29 ENCOUNTER — Other Ambulatory Visit (INDEPENDENT_AMBULATORY_CARE_PROVIDER_SITE_OTHER): Payer: Self-pay

## 2023-04-29 ENCOUNTER — Ambulatory Visit: Payer: Medicare PPO | Admitting: Orthopaedic Surgery

## 2023-04-29 DIAGNOSIS — M79671 Pain in right foot: Secondary | ICD-10-CM | POA: Diagnosis not present

## 2023-04-29 NOTE — Progress Notes (Signed)
 Office Visit Note   Patient: Dawn Jimenez           Date of Birth: 1935-09-09           MRN: [id number] Visit Date: 04/29/2023              Requested by: Dawn Eaton, MD No address on file PCP: Dawn Eaton, MD   Assessment & Plan: Visit Diagnoses:  1. Pain in right foot     Plan: Dawn Jimenez is a very pleasant 88 year old female with a symptomatic callus of the right first metatarsal head.  She is able to wear her current sandals but has trouble with any other shoes due to mechanical irritation and sensitivity.  We made a doughnut for her to try.  If this does not improve we will send her to Dr. Lajoyce Jimenez for further recommendations.  Follow-Up Instructions: No follow-ups on file.   Orders:  Orders Placed This Encounter  Procedures   XR Foot Complete Right   No orders of the defined types were placed in this encounter.     Procedures: No procedures performed   Clinical Data: No additional findings.   Subjective: Chief Complaint  Patient presents with   Right Foot - Pain    HPI Dawn Jimenez is a very pleasant 88 year old retired Engineer, civil (consulting) who comes in for evaluation of 2 weeks of right foot pain.  She underwent a bunionectomy with a silver procedure in 2018 at [facility].  She did well from surgery and recently she started developing foot pain over the medial aspect of the first metatarsal head.  Denies any injuries.  She has been wearing sandals which do not bother the foot but she has pain with any other shoes. Review of Systems  Constitutional: Negative.   HENT: Negative.    Eyes: Negative.   Respiratory: Negative.    Cardiovascular: Negative.   Endocrine: Negative.   Musculoskeletal: Negative.   Neurological: Negative.   Hematological: Negative.   Psychiatric/Behavioral: Negative.    All other systems reviewed and are negative.    Objective: Vital Signs: There were no vitals taken for this visit.  Physical Exam Vitals and nursing note reviewed.   Constitutional:      Appearance: She is well-developed.  HENT:     Head: Atraumatic.     Nose: Nose normal.  Eyes:     Extraocular Movements: Extraocular movements intact.  Cardiovascular:     Pulses: Normal pulses.  Pulmonary:     Effort: Pulmonary effort is normal.  Abdominal:     Palpations: Abdomen is soft.  Musculoskeletal:     Cervical back: Neck supple.  Skin:    General: Skin is warm.     Capillary Refill: Capillary refill takes less than 2 seconds.  Neurological:     Mental Status: She is alert. Mental status is at baseline.  Psychiatric:        Behavior: Behavior normal.        Thought Content: Thought content normal.        Judgment: Judgment normal.     Ortho Exam Examination of the right foot shows postsurgical changes with fully healed scars.  She has a callus on the medial side of her first metatarsal that is very tender to touch.  The skin is intact.  There is no signs of infection.  The MTP joint is asymptomatic with range of motion and with grind test. Specialty Comments:  No specialty comments available.  Imaging: XR Foot Complete  Right Result Date: 04/29/2023 X-rays of the right foot show hallux valgus deformity.  Generalized osteopenia of the first ray.     PMFS History: Patient Active Problem List   Diagnosis Date Noted   Acute diarrhea 03/31/2018   Atrial tachycardia (HCC) 05/02/2017   Dizziness 08/25/2015   Closed displaced fracture of neck of fifth metacarpal bone of left hand with malunion 08/04/2015   Risk for falls 07/02/2015   Stiffness of finger joint of left hand 06/03/2015   Closed displaced fracture of neck of fifth metacarpal bone of left hand 05/21/2015   Dislocation of proximal interphalangeal joint of left little finger 05/21/2015   Pain 05/21/2015   Closed displaced fracture of shaft of fifth metacarpal bone of left hand 04/08/2015   Dislocation of IP joint of hand, left, closed 04/08/2015   Nonvenomous insect bite of foot  09/20/2014   Awareness of heartbeats 06/19/2014   Arthritis 08/21/2013   Hyperlipidemia 08/21/2013   Hypertension 08/21/2013   Osteopenia 08/21/2013   Multiple pulmonary nodules 08/30/2011   Pulmonary nodules 08/30/2011   Coronary artery calcification seen on CAT scan 06/07/2011   Allergic rhinitis 05/20/2011   History of total hysterectomy with bilateral salpingo-oophorectomy (BSO) 05/20/2011   Status post total abdominal hysterectomy and bilateral salpingo-oophorectomy 05/20/2011   Past Medical History:  Diagnosis Date   Allergy    Arthritis    osteoarthritis   Atrial tachycardia (HCC)    Cataract    bil cateracts removed   GERD (gastroesophageal reflux disease)    Hyperlipemia    Hypertension    Palpitations    Seasonal allergies     Family History  Problem Relation Age of Onset   CVA Mother    Hypertension Mother    Hypertension Father    Prostate cancer Father    Colon cancer Neg Hx    Stomach cancer Neg Hx    Liver disease Neg Hx    Esophageal cancer Neg Hx    Pancreatic cancer Neg Hx    Rectal cancer Neg Hx     Past Surgical History:  Procedure Laterality Date   ABDOMINAL HYSTERECTOMY     ADENOIDECTOMY     BREAST MASS EXCISION     CATARACT EXTRACTION     COLONOSCOPY     DILATION AND CURETTAGE OF UTERUS     FOOT SURGERY Right    TONSILLECTOMY     WISDOM TOOTH EXTRACTION     Social History   Occupational History   Not on file  Tobacco Use   Smoking status: Former    Current packs/day: 0.00    Average packs/day: 0.3 packs/day for 6.0 years (1.5 ttl pk-yrs)    Types: Cigarettes    Start date: 89    Quit date: 1963    Years since quitting: 62.2   Smokeless tobacco: Never  Vaping Use   Vaping status: Never Used  Substance and Sexual Activity   Alcohol use: Yes    Alcohol/week: 7.0 standard drinks of alcohol    Types: 4 Glasses of wine, 3 Shots of liquor per week    Comment: occasionally   Drug use: No   Sexual activity: Not on file

## 2023-05-16 ENCOUNTER — Ambulatory Visit: Admitting: Orthopedic Surgery

## 2023-05-16 ENCOUNTER — Encounter: Payer: Self-pay | Admitting: Orthopedic Surgery

## 2023-05-16 DIAGNOSIS — M79671 Pain in right foot: Secondary | ICD-10-CM | POA: Diagnosis not present

## 2023-05-16 NOTE — Progress Notes (Signed)
 Office Visit Note   Patient: Dawn Jimenez           Date of Birth: 03/29/1935           MRN: [id number] Visit Date: 05/16/2023              Requested by: Nadara Eaton, MD No address on file PCP: Nadara Eaton, MD  Chief Complaint  Patient presents with   Right Foot - Pain      HPI: Patient is an 88 year old woman who was seen for initial evaluation for right foot pain and referral from Dr. Roda Shutters.  Patient is status post bunion surgery and states she has pain over the medial border of the great toe.  Patient has been wearing wide shoes.  Assessment & Plan: Visit Diagnoses:  1. Pain in right foot     Plan: The hyperkeratotic lesion was pared she tolerated this well reevaluate if her symptoms worsen.  Follow-Up Instructions: Return if symptoms worsen or fail to improve.   Ortho Exam  Patient is alert, oriented, no adenopathy, well-dressed, normal affect, normal respiratory effort. Examination patient has no pain with range of motion or palpation over the great toe MTP joint.  She is point tender to palpation of the plantar medial aspect of the first metatarsal head.  Patient does have a hyperkeratotic lesion.  After informed consent a 10 blade knife was used to pare the lesion.  Patient states that her symptoms were improved after the hyperkeratotic lesion was pared.  Imaging: No results found. No images are attached to the encounter.  Labs: No results found for: "HGBA1C", "ESRSEDRATE", "CRP", "LABURIC", "REPTSTATUS", "GRAMSTAIN", "CULT", "LABORGA"   No results found for: "ALBUMIN", "PREALBUMIN", "CBC"  No results found for: "MG" No results found for: "VD25OH"  No results found for: "PREALBUMIN"     No data to display           There is no height or weight on file to calculate BMI.  Orders:  No orders of the defined types were placed in this encounter.  No orders of the defined types were placed in this encounter.    Procedures: No procedures  performed  Clinical Data: No additional findings.  ROS:  All other systems negative, except as noted in the HPI. Review of Systems  Objective: Vital Signs: There were no vitals taken for this visit.  Specialty Comments:  No specialty comments available.  PMFS History: Patient Active Problem List   Diagnosis Date Noted   Acute diarrhea 03/31/2018   Atrial tachycardia (HCC) 05/02/2017   Dizziness 08/25/2015   Closed displaced fracture of neck of fifth metacarpal bone of left hand with malunion 08/04/2015   Risk for falls 07/02/2015   Stiffness of finger joint of left hand 06/03/2015   Closed displaced fracture of neck of fifth metacarpal bone of left hand 05/21/2015   Dislocation of proximal interphalangeal joint of left little finger 05/21/2015   Pain 05/21/2015   Closed displaced fracture of shaft of fifth metacarpal bone of left hand 04/08/2015   Dislocation of IP joint of hand, left, closed 04/08/2015   Nonvenomous insect bite of foot 09/20/2014   Awareness of heartbeats 06/19/2014   Arthritis 08/21/2013   Hyperlipidemia 08/21/2013   Hypertension 08/21/2013   Osteopenia 08/21/2013   Multiple pulmonary nodules 08/30/2011   Pulmonary nodules 08/30/2011   Coronary artery calcification seen on CAT scan 06/07/2011   Allergic rhinitis 05/20/2011   History of total hysterectomy with bilateral salpingo-oophorectomy (BSO)  05/20/2011   Status post total abdominal hysterectomy and bilateral salpingo-oophorectomy 05/20/2011   Past Medical History:  Diagnosis Date   Allergy    Arthritis    osteoarthritis   Atrial tachycardia (HCC)    Cataract    bil cateracts removed   GERD (gastroesophageal reflux disease)    Hyperlipemia    Hypertension    Palpitations    Seasonal allergies     Family History  Problem Relation Age of Onset   CVA Mother    Hypertension Mother    Hypertension Father    Prostate cancer Father    Colon cancer Neg Hx    Stomach cancer Neg Hx     Liver disease Neg Hx    Esophageal cancer Neg Hx    Pancreatic cancer Neg Hx    Rectal cancer Neg Hx     Past Surgical History:  Procedure Laterality Date   ABDOMINAL HYSTERECTOMY     ADENOIDECTOMY     BREAST MASS EXCISION     CATARACT EXTRACTION     COLONOSCOPY     DILATION AND CURETTAGE OF UTERUS     FOOT SURGERY Right    TONSILLECTOMY     WISDOM TOOTH EXTRACTION     Social History   Occupational History   Not on file  Tobacco Use   Smoking status: Former    Current packs/day: 0.00    Average packs/day: 0.3 packs/day for 6.0 years (1.5 ttl pk-yrs)    Types: Cigarettes    Start date: 23    Quit date: 1963    Years since quitting: 62.2   Smokeless tobacco: Never  Vaping Use   Vaping status: Never Used  Substance and Sexual Activity   Alcohol use: Yes    Alcohol/week: 7.0 standard drinks of alcohol    Types: 4 Glasses of wine, 3 Shots of liquor per week    Comment: occasionally   Drug use: No   Sexual activity: Not on file

## 2023-07-04 ENCOUNTER — Telehealth (HOSPITAL_BASED_OUTPATIENT_CLINIC_OR_DEPARTMENT_OTHER): Payer: Self-pay | Admitting: *Deleted

## 2023-07-04 NOTE — Telephone Encounter (Signed)
   Pre-operative Risk Assessment    Patient Name: Dawn Jimenez  DOB: Jan 15, 1936 MRN: [id number]   Date of last office visit: 07/01/2022 Date of next office visit: None  Request for Surgical Clearance   Procedure:  right first metatarsophalenageal joint arthrodesis Date of Surgery:  Clearance 09/01/23                                 Surgeon:  Dr. Vickki Grandchild Surgeon's Group or Practice Name:  [facility] Phone number:  [phone] Fax number:  [phone]   Type of Clearance Requested:   - Medical  - Pharmacy:  Hold Aspirin Not indicated   Type of Anesthesia:   Choice   Additional requests/questions:    Signed, Lauris Port   07/04/2023, 2:09 PM

## 2023-07-20 NOTE — Telephone Encounter (Signed)
 I s/w the pt and asked if she would like to schedule her appt in the office for preop clearance sooner than the 08/29/23 that was previously scheduled earlier today by CMA.   Pt appt now is 08/04/23 with Charles Connor, NP. Pt is aware of our new office location. I will update all parties involved.

## 2023-07-20 NOTE — Telephone Encounter (Signed)
 Pt has appt 08/29/23 with Liane Redman, [facility] 08/29/23 per Jamal Mays. CMA. Pt last seen 06/30/20 by Dr. Ardell Beauvais. I will update all parties involved.

## 2023-07-20 NOTE — Telephone Encounter (Signed)
   Name: Dawn Jimenez  DOB: Aug 20, 1935  MRN: [id number]  Primary Cardiologist: Christoper Crafts, MD  Chart reviewed as part of pre-operative protocol coverage. Because of Dawn Jimenez past medical history and time since last visit, she will require a follow-up in-office visit in order to better assess preoperative cardiovascular risk. Last seen by Ardell Beauvais 06/30/2020.   Pre-op covering staff: - Please schedule appointment and call patient to inform them. If patient already had an upcoming appointment within acceptable timeframe, please add "pre-op clearance" to the appointment notes so provider is aware. - Please contact requesting surgeon's office via preferred method (i.e, phone, fax) to inform them of need for appointment prior to surgery.    Friddie Jetty, NP  07/20/2023, 2:21 PM

## 2023-08-03 NOTE — Progress Notes (Signed)
 Cardiology Office Note    Patient Name: Dawn Jimenez Date of Encounter: 08/03/2023  Primary Care Provider:  Sherlyn Ditto, MD Primary Cardiologist:  Christoper Crafts, MD Primary Electrophysiologist: None   Past Medical History    Past Medical History:  Diagnosis Date   Allergy    Arthritis    osteoarthritis   Atrial tachycardia (HCC)    Cataract    bil cateracts removed   GERD (gastroesophageal reflux disease)    Hyperlipemia    Hypertension    Palpitations    Seasonal allergies     History of Present Illness  Dawn Jimenez is a 88 y.o. female with a PMH of coronary calcifications, nonsustained atrial tachycardia, HLD, HTN who presents today for overdue follow-up and preoperative clearance.  Ms. Raggio was followed initially by Dr. Nicholette Barley and was recently followed by Dr. Ardell Beauvais and will require new cardiology service moving forward.  She was seen initially in 2016 for complaint of palpitations.  She wore an event monitor at [facility] in [facility] that showed 14 runs of atrial tachycardia.  She had a 2D echo completed that was normal and was started on Toprol-XL 25 mg.  She was seen in December 2020 with lower extremity edema and was placed on low-dose HCTZ with improvement in symptoms.  She was last seen by Dr. Ardell Beauvais on 07/01/2022 and was doing well overall but noticed some irregular heartbeats but endorsed no chest pain.  She was attending workouts Monday Wednesday and Friday for resistance training.  She completed her most recent ischemic evaluation by [facility]  in 05/2020 that was low risk.  She endorsed some chest discomfort that was found to be likely related to esophageal stricture that required previous dilatation.  Blood pressures were at goal and patient was advised to increase statins due to coronary calcifications noted on CT.    Patient denies chest pain, palpitations, dyspnea, PND, orthopnea, nausea, vomiting, dizziness, syncope, edema, weight gain, or  early satiety.   Discussed the use of AI scribe software for clinical note transcription with the patient, who gave verbal consent to proceed.  History of Present Illness    ***Notes: right first metatarsophalenageal joint arthrodesis  - Need guidance on holding ASA 81 mg   Review of Systems  Please see the history of present illness.    All other systems reviewed and are otherwise negative except as noted above.  Physical Exam     Wt Readings from Last 3 Encounters:  07/01/22 125 lb 6.4 oz (56.9 kg)  01/30/22 120 lb (54.4 kg)  12/31/21 120 lb (54.4 kg)   ZO:[id number] were no vitals filed for this visit.,There is no height or weight on file to calculate BMI. GEN: Well nourished, well developed in no acute distress Neck: No JVD; No carotid bruits Pulmonary: Clear to auscultation without rales, wheezing or rhonchi  Cardiovascular: Normal rate. Regular rhythm. Normal S1. Normal S2.   Murmurs: There is no murmur.  ABDOMEN: Soft, non-tender, non-distended EXTREMITIES:  No edema; No deformity   EKG/LABS/ Recent Cardiac Studies   ECG personally reviewed by me today - ***  Risk Assessment/Calculations:   {Does this patient have ATRIAL FIBRILLATION?:[id number]}      No results found for: "WBC", "HGB", "HCT", "MCV", "PLT" No results found for: "CREATININE", "BUN", "NA", "K", "CL", "CO2" Lab Results  Component Value Date   CHOL 139 08/25/2021   HDL 61 08/25/2021   LDLCALC 64 08/25/2021   TRIG 69 08/25/2021   CHOLHDL 2.3 08/25/2021  No results found for: "HGBA1C" Assessment & Plan    Assessment and Plan Assessment & Plan     1.  Preoperative clearance  2.  Coronary calcifications  3.  Essential hypertension  4.  Hyperlipidemia  5.  Nonsustained atrial tachycardia:      Disposition: Follow-up with Christoper Crafts, MD or APP in *** months {Are you ordering a CV Procedure (e.g. stress test, cath, DCCV, TEE, etc)?   Press F2        :[id number]}   Signed, Francene Ing, Retha Cast, NP 08/03/2023, 11:19 AM [facility]

## 2023-08-04 ENCOUNTER — Ambulatory Visit: Attending: Nurse Practitioner | Admitting: Nurse Practitioner

## 2023-08-04 ENCOUNTER — Encounter: Payer: Self-pay | Admitting: Nurse Practitioner

## 2023-08-04 VITALS — BP 126/64 | HR 61 | Ht 64.0 in | Wt 126.8 lb

## 2023-08-04 DIAGNOSIS — I10 Essential (primary) hypertension: Secondary | ICD-10-CM

## 2023-08-04 DIAGNOSIS — E782 Mixed hyperlipidemia: Secondary | ICD-10-CM | POA: Diagnosis not present

## 2023-08-04 DIAGNOSIS — Z01810 Encounter for preprocedural cardiovascular examination: Secondary | ICD-10-CM

## 2023-08-04 DIAGNOSIS — I2510 Atherosclerotic heart disease of native coronary artery without angina pectoris: Secondary | ICD-10-CM

## 2023-08-04 DIAGNOSIS — I4719 Other supraventricular tachycardia: Secondary | ICD-10-CM

## 2023-08-04 NOTE — Patient Instructions (Signed)
 Medication Instructions:  No changes *If you need a refill on your cardiac medications before your next appointment, please call your pharmacy*  Lab Work: No labs If you have labs (blood work) drawn today and your tests are completely normal, you will receive your results only by: MyChart Message (if you have MyChart) OR A paper copy in the mail If you have any lab test that is abnormal or we need to change your treatment, we will call you to review the results.  Testing/Procedures: No testing  Follow-Up: At [facility], you and your health needs are our priority.  As part of our continuing mission to provide you with exceptional heart care, our providers are all part of one team.  This team includes your primary Cardiologist (physician) and Advanced Practice Providers or APPs (Physician Assistants and Nurse Practitioners) who all work together to provide you with the care you need, when you need it.  Your next appointment:   6 month(s)  Provider:   Charles Connor, NP  We recommend signing up for the patient portal called "MyChart".  Sign up information is provided on this After Visit Summary.  MyChart is used to connect with patients for Virtual Visits (Telemedicine).  Patients are able to view lab/test results, encounter notes, upcoming appointments, etc.  Non-urgent messages can be sent to your provider as well.   To learn more about what you can do with MyChart, go to [email or web address].

## 2023-08-29 ENCOUNTER — Ambulatory Visit: Admitting: Cardiology

## 2024-01-02 ENCOUNTER — Encounter: Payer: Self-pay | Admitting: Radiology

## 2024-01-19 ENCOUNTER — Other Ambulatory Visit: Payer: Self-pay

## 2024-01-19 ENCOUNTER — Encounter (HOSPITAL_BASED_OUTPATIENT_CLINIC_OR_DEPARTMENT_OTHER): Payer: Self-pay | Admitting: Urology

## 2024-01-19 ENCOUNTER — Emergency Department (HOSPITAL_BASED_OUTPATIENT_CLINIC_OR_DEPARTMENT_OTHER)
Admission: EM | Admit: 2024-01-19 | Discharge: 2024-01-19 | Disposition: A | Discharge status: Home / Self Care | Attending: Emergency Medicine | Admitting: Emergency Medicine

## 2024-01-19 ENCOUNTER — Emergency Department (HOSPITAL_BASED_OUTPATIENT_CLINIC_OR_DEPARTMENT_OTHER)

## 2024-01-19 DIAGNOSIS — Z7982 Long term (current) use of aspirin: Secondary | ICD-10-CM | POA: Insufficient documentation

## 2024-01-19 DIAGNOSIS — I10 Essential (primary) hypertension: Secondary | ICD-10-CM | POA: Diagnosis not present

## 2024-01-19 DIAGNOSIS — L03031 Cellulitis of right toe: Secondary | ICD-10-CM

## 2024-01-19 DIAGNOSIS — M79674 Pain in right toe(s): Secondary | ICD-10-CM | POA: Insufficient documentation

## 2024-01-19 MED ORDER — SULFAMETHOXAZOLE-TRIMETHOPRIM 800-160 MG PO TABS: 1.0000 | ORAL_TABLET | Freq: Two times a day (BID) | ORAL | 0 refills | Status: AC

## 2024-01-19 MED ORDER — LIDOCAINE HCL 2 % IJ SOLN
10.0000 mL | Freq: Once | INTRAMUSCULAR | Status: AC
  Administered 2024-01-19: 200 mg
  Filled 2024-01-19: qty 20

## 2024-01-19 MED ORDER — SULFAMETHOXAZOLE-TRIMETHOPRIM 800-160 MG PO TABS
1.0000 | ORAL_TABLET | Freq: Once | ORAL | Status: AC
  Administered 2024-01-19: 1 via ORAL
  Filled 2024-01-19: qty 1

## 2024-01-19 NOTE — Discharge Instructions (Signed)
 As we discussed you have some inflammation and infection underneath the nail of the right big foot.  Please continue Keflex as prescribed by your doctor and have added Bactrim twice a day for 7 days  Please continue your warm soaks with Betadine 3 times a day  Please change the dressing when it gets soaked  Follow-up with your podiatrist and primary care doctor  Return to ER if you have worse swelling or pain or purulent discharge

## 2024-01-19 NOTE — ED Provider Notes (Signed)
 [name] Provider Note   CSN: [id number] Arrival date & time: 01/19/24  1352     Patient presents with: Toe Issue    Dawn Jimenez is a 88 y.o. female history of hypertension, here presenting with right big toe pain and swelling.  Patient states that she had bunionectomy previously.  She states that several weeks ago, she had a pedicure and someone accidentally nicked the skin.  She states that she has been having some swelling and pain around that area since then.  Patient saw her doctor at [facility] and was prescribed Keflex and's been taking it for the last 2 days.  Today she woke up and noticed some swelling of the right foot.  She states that the redness around the right big toe has decreased.  She denies any fever.  She is concerned that there may be pus underneath that area.  She also has been soaking it with Betadine 3 times a day. 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       Prior to Admission medications   Medication Sig Start Date End Date Taking? Authorizing Provider  sulfamethoxazole-trimethoprim (BACTRIM DS) 800-160 MG tablet Take 1 tablet by mouth 2 (two) times daily for 7 days. 01/19/24 01/26/24 Yes Patt Alm Macho, MD  aspirin 81 MG tablet Take 81 mg by mouth daily.    [provider]  Azelastine HCl 137 MCG/SPRAY SOLN Place 1 spray into the nose. 12/30/20   [provider]  Calcium  Carb-Cholecalciferol (CALCIUM  PLUS VITAMIN D3 PO) Take 2,000 mcg by mouth daily.    [provider]  carvedilol  (COREG ) 3.125 MG tablet Take 3.125 mg by mouth 2 (two) times daily with a meal.    [provider]  fluticasone (FLONASE) 50 MCG/ACT nasal spray Place into both nostrils daily.    [provider]  hydrochlorothiazide  (HYDRODIURIL ) 25 MG tablet Take 1 tablet (25 mg total) by mouth daily. 08/27/21   Hobart Powell BRAVO, MD  ketoconazole (NIZORAL) 2  % shampoo Apply topically. 03/03/21   [provider]  lisinopril  (ZESTRIL ) 20 MG tablet TAKE 1 TABLET BY MOUTH DAILY 06/02/20   Lelon Hamilton T, PA-C  loratadine (CLARITIN) 10 MG tablet Take by mouth.    [provider]  pantoprazole  (PROTONIX ) 40 MG tablet Take 1 tablet (40 mg total) by mouth daily. Take one 40 mg. Tablet by mouth each morning. 12/31/19   Aneita Gwendlyn DASEN, MD  rosuvastatin  (CRESTOR ) 20 MG tablet Take 1 tablet (20 mg total) by mouth daily. 07/08/22   Hobart Powell BRAVO, MD    Allergies: Cefixime and Meperidine    Review of Systems  Skin:        Right big toe pain  All other systems reviewed and are negative.   Updated Vital Signs BP 137/76 (BP Location: Right Arm)   Pulse 81   Temp 98.2 F (36.8 C)   Resp 18   Ht 5' 4 (1.626 m)   Wt 57.5 kg   SpO2 96%   BMI 21.76 kg/m   Physical Exam Vitals and nursing note reviewed.  Constitutional:      Appearance: Normal appearance.  HENT:     Head: Normocephalic.     Nose: Nose normal.     Mouth/Throat:     Mouth: Mucous membranes are moist.  Eyes:     Pupils: Pupils are equal, round, and reactive to light.  Cardiovascular:     Rate and Rhythm: Normal rate.     Pulses: Normal pulses.  Pulmonary:     Effort: Pulmonary effort is normal.  Abdominal:     General: Abdomen is flat.  Musculoskeletal:     Cervical back: Normal range of motion.     Comments: Right big toe with some swelling of the lateral aspect.  Questionable small paronychia.  No obvious felon  Skin:    General: Skin is warm.     Capillary Refill: Capillary refill takes less than 2 seconds.  Neurological:     General: No focal deficit present.     Mental Status: She is alert.  Psychiatric:        Mood and Affect: Mood normal.        Behavior: Behavior normal.     (all labs ordered are listed, but only abnormal results are displayed) Labs Reviewed - No data to display  EKG: None  Radiology: DG Toe Great Right Result Date:  01/19/2024 CLINICAL DATA:  Right pleural fluid. EXAM: RIGHT GREAT TOE COMPARISON:  Left foot radiograph dated 04/21/2023. FINDINGS: Postsurgical changes of the right hallux valgus with fixation hardware. The hardware is intact. There is no acute fracture or dislocation. The bones are osteopenic. There is diffuse soft tissue swelling. No soft tissue gas. IMPRESSION: 1. No acute fracture or dislocation. 2. Postsurgical changes of the right hallux valgus. Electronically Signed   By: Vanetta Chou M.D.   On: 01/19/2024 15:13     Procedures   INCISION AND DRAINAGE Performed by: Alm VEAR Cave Consent: Verbal consent obtained. Risks and benefits: risks, benefits and alternatives were discussed Type: abscess  Body area: R big toe  Anesthesia: local infiltration  Incision was made with a scalpel.  Local anesthetic: lidocaine  2% no epinephrine   Anesthetic total: 10 ml  Complexity: complex Blunt dissection to break up loculations  Drainage: purulent  Drainage amount: moderate   Packing material: none  Patient tolerance: Patient tolerated the procedure well with no immediate complications.    Medications Ordered in the ED  sulfamethoxazole -trimethoprim  (BACTRIM  DS) 800-160 MG per tablet 1 tablet (has no administration in time range)  lidocaine  (XYLOCAINE ) 2 % (with pres) injection 200 mg (200 mg Infiltration Given 01/19/24 1529)                                    Medical Decision Making [name] is a 88 y.o. female here presenting with right big toe pain and swelling.  Likely inflammation versus small paronychia.  X-ray did not show any osteomyelitis or fracture.  I&D performed and small amount of purulent drainage came out and mostly serosanguineous.  I was able to wrap the wound.  Will add Bactrim  to Keflex.  Will have her follow-up with her doctor.   Problems Addressed: Paronychia of great toe of right foot: acute illness or injury  Amount and/or Complexity of Data  Reviewed Radiology: ordered.  Risk Prescription drug management.    Final diagnoses:  None    ED Discharge Orders          Ordered    sulfamethoxazole -trimethoprim  (BACTRIM  DS) 800-160 MG tablet  2 times daily        01/19/24 1540               Cave Alm Macho, MD 01/19/24 1546

## 2024-01-19 NOTE — ED Notes (Signed)

## 2024-01-19 NOTE — ED Triage Notes (Signed)
 Pt states concern for infected toe  States noticed it last week and has seen NP and started antibiotics  Using foot soaks as well. Was getting better but today has noticed more swelling and now having throbbing pain  Lives at [place] H/o sx to  big toe fusion in July

## 2024-01-27 ENCOUNTER — Other Ambulatory Visit: Payer: Self-pay

## 2024-01-27 ENCOUNTER — Emergency Department (HOSPITAL_BASED_OUTPATIENT_CLINIC_OR_DEPARTMENT_OTHER)
Admission: EM | Admit: 2024-01-27 | Discharge: 2024-01-27 | Disposition: A | Discharge status: Home / Self Care | Attending: Emergency Medicine | Admitting: Emergency Medicine

## 2024-01-27 DIAGNOSIS — L299 Pruritus, unspecified: Secondary | ICD-10-CM | POA: Diagnosis present

## 2024-01-27 DIAGNOSIS — Z79899 Other long term (current) drug therapy: Secondary | ICD-10-CM | POA: Diagnosis not present

## 2024-01-27 DIAGNOSIS — Z7982 Long term (current) use of aspirin: Secondary | ICD-10-CM | POA: Insufficient documentation

## 2024-01-27 DIAGNOSIS — T7840XA Allergy, unspecified, initial encounter: Secondary | ICD-10-CM | POA: Insufficient documentation

## 2024-01-27 MED ORDER — DEXAMETHASONE SOD PHOSPHATE PF 10 MG/ML IJ SOLN
8.0000 mg | Freq: Once | INTRAMUSCULAR | Status: AC
  Administered 2024-01-27: 8 mg via INTRAMUSCULAR

## 2024-01-27 MED ORDER — DIPHENHYDRAMINE HCL 25 MG PO CAPS
25.0000 mg | ORAL_CAPSULE | Freq: Once | ORAL | Status: AC
  Administered 2024-01-27: 25 mg via ORAL
  Filled 2024-01-27: qty 1

## 2024-01-27 NOTE — Discharge Instructions (Addendum)
 Return to the ER for worsening lip or tongue or throat swelling, difficulty breathing, lightheadedness, or any other emergency medical concerns.

## 2024-01-27 NOTE — ED Triage Notes (Signed)
 Pt presents with lip swelling that started 3 hrs ago. Also reports throat tightness. Also have some generalized rash. Unknown allergen. Reports having a peanut butter & jelly sandwich for lunch but have never been allergic to same.   Denies chest pain/SOB

## 2024-01-27 NOTE — ED Provider Notes (Signed)
 [place] EMERGENCY DEPARTMENT AT [facility] [place] Provider [place]   CSN: [id number] Arrival date & time: 01/27/24  1702     Patient presents with: Allergic Reaction   Dawn Jimenez is a 88 y.o. female presenting to the ER with complaint of lip swelling and itchy spots on her body.  Patient ports this began about 2 to 3 hours ago.  She says she does get allergy injections for seasonal allergies but has never had urticaria or anaphylaxis, does not currently have an EpiPen .  She is not aware of exposure to any new substances or foods.  She had a peanut butter and jelly sandwich for lunch which she has had before.  She says she took a Zyrtec at home prior to coming into the hospital but did not get better.  She lives by herself and she was concerned which prompted her to come to the ER.  She says she has some tingling on her tongue which appears better, she still feels swelling in her lower lip, and has a few itchy spots on her body.   HPI     Prior to Admission medications   Medication Sig Start Date End Date Taking? Authorizing Provider  aspirin 81 MG tablet Take 81 mg by mouth daily.    [provider]  Azelastine HCl 137 MCG/SPRAY SOLN Place 1 spray into the nose. 12/30/20   [provider]  Calcium  Carb-Cholecalciferol (CALCIUM  PLUS VITAMIN D3 PO) Take 2,000 mcg by mouth daily.    [provider]  carvedilol  (COREG ) 3.125 MG tablet Take 3.125 mg by mouth 2 (two) times daily with a meal.    [provider]  fluticasone (FLONASE) 50 MCG/ACT nasal spray Place into both nostrils daily.    [provider]  hydrochlorothiazide  (HYDRODIURIL ) 25 MG tablet Take 1 tablet (25 mg total) by mouth daily. 08/27/21   Hobart Powell BRAVO, MD  ketoconazole (NIZORAL) 2 % shampoo Apply topically. 03/03/21   [provider]  lisinopril  (ZESTRIL ) 20 MG tablet TAKE 1 TABLET BY MOUTH DAILY 06/02/20   Lelon Hamilton T, PA-C  loratadine (CLARITIN) 10 MG  tablet Take by mouth.    [provider]  pantoprazole  (PROTONIX ) 40 MG tablet Take 1 tablet (40 mg total) by mouth daily. Take one 40 mg. Tablet by mouth each morning. 12/31/19   Aneita Gwendlyn DASEN, MD  rosuvastatin  (CRESTOR ) 20 MG tablet Take 1 tablet (20 mg total) by mouth daily. 07/08/22   Hobart Powell BRAVO, MD    Allergies: Cefixime and Meperidine    Review of Systems  Updated Vital Signs BP (!) 148/66   Pulse (!) 58   Temp 97.8 F (36.6 C) (Oral)   Resp 16   SpO2 99%   Physical Exam Constitutional:      General: She is not in acute distress. HENT:     Head: Normocephalic and atraumatic.     Comments: Isolated swelling of lower lip Oropharynx non-erythematous.  No tonsillar swelling or exudate.  No uvular deviation.  No drooling. No brawny edema. No stridor. Voice is not muffled.  Eyes:     Conjunctiva/sclera: Conjunctivae normal.     Pupils: Pupils are equal, round, and reactive to light.  Cardiovascular:     Rate and Rhythm: Normal rate and regular rhythm.  Pulmonary:     Effort: Pulmonary effort is normal. No respiratory distress.  Abdominal:     General: There is no distension.     Tenderness: There is no abdominal tenderness.  Skin:    General: Skin is warm and dry.     Comments: Isolated urticaria of the left arm, chest, back  Neurological:     General: No focal deficit present.     Mental Status: She is alert. Mental status is at baseline.  Psychiatric:        Mood and Affect: Mood normal.        Behavior: Behavior normal.     (all labs ordered are listed, but only abnormal results are displayed) Labs Reviewed - No data to display  EKG: None  Radiology: No results found.   Procedures   Medications Ordered in the ED  diphenhydrAMINE (BENADRYL) capsule 25 mg (25 mg Oral Given 01/27/24 1744)  dexamethasone (DECADRON) injection 8 mg (8 mg Intramuscular Given 01/27/24 1744)                                    Medical Decision  Making Risk Prescription drug management.   Patient is here with suspected allergic reaction or contact allergy.  Lower suspicion for angioedema given no prior history and given the urticaria on her body.  Does not clear what may have set her off.  Will give her Benadryl, IM Decadron, monitor for period of time in the ED.  No indication for EpiPen .  Airway exam is patent, no respiratory distress.  Doubt anaphylaxis.  Reassessed at 8 pm - swelling improved, stable for discharge. Can continue zyrtec daytime and benadryl PRN sparingly bedtime for next 2-3 days until she can schedule PCP follow up.  Return precautions discussed.     Final diagnoses:  Allergic reaction, initial encounter    ED Discharge Orders     None          Marla Pouliot, Dawn PARAS, MD 01/27/24 [phone]

## 2024-01-27 NOTE — ED Notes (Signed)
Ambulatory to bathroom without difficulty.
